# Patient Record
Sex: Male | Born: 1985 | Race: White | Hispanic: No | Marital: Single | Smoking: Current every day smoker
Health system: Southern US, Community
[De-identification: ages and names within clinical notes are randomized; demographics above are authoritative.]

## PROBLEM LIST (undated history)

## (undated) DIAGNOSIS — F419 Anxiety disorder, unspecified: Secondary | ICD-10-CM

## (undated) DIAGNOSIS — J45909 Unspecified asthma, uncomplicated: Secondary | ICD-10-CM

## (undated) DIAGNOSIS — R569 Unspecified convulsions: Secondary | ICD-10-CM

## (undated) HISTORY — PX: EYE SURGERY: SHX253

---
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
## 2015-05-06 ENCOUNTER — Encounter (HOSPITAL_COMMUNITY): Payer: Self-pay | Admitting: *Deleted

## 2015-05-06 ENCOUNTER — Emergency Department (HOSPITAL_COMMUNITY)
Admission: EM | Admit: 2015-05-06 | Discharge: 2015-05-07 | Disposition: A | Discharge status: Home / Self Care | Payer: Self-pay | Attending: Emergency Medicine | Admitting: Emergency Medicine

## 2015-05-06 ENCOUNTER — Emergency Department (HOSPITAL_COMMUNITY)
Admission: EM | Admit: 2015-05-06 | Discharge: 2015-05-06 | Disposition: A | Discharge status: Home / Self Care | Payer: Self-pay | Attending: Emergency Medicine | Admitting: Emergency Medicine

## 2015-05-06 ENCOUNTER — Encounter (HOSPITAL_COMMUNITY): Payer: Self-pay | Admitting: Emergency Medicine

## 2015-05-06 DIAGNOSIS — R42 Dizziness and giddiness: Secondary | ICD-10-CM | POA: Insufficient documentation

## 2015-05-06 DIAGNOSIS — R531 Weakness: Secondary | ICD-10-CM | POA: Insufficient documentation

## 2015-05-06 DIAGNOSIS — J45909 Unspecified asthma, uncomplicated: Secondary | ICD-10-CM | POA: Insufficient documentation

## 2015-05-06 DIAGNOSIS — Z792 Long term (current) use of antibiotics: Secondary | ICD-10-CM | POA: Insufficient documentation

## 2015-05-06 DIAGNOSIS — G40909 Epilepsy, unspecified, not intractable, without status epilepticus: Secondary | ICD-10-CM | POA: Insufficient documentation

## 2015-05-06 DIAGNOSIS — E860 Dehydration: Secondary | ICD-10-CM | POA: Insufficient documentation

## 2015-05-06 DIAGNOSIS — Z720 Tobacco use: Secondary | ICD-10-CM | POA: Insufficient documentation

## 2015-05-06 HISTORY — DX: Anxiety disorder, unspecified: F41.9

## 2015-05-06 HISTORY — DX: Unspecified asthma, uncomplicated: J45.909

## 2015-05-06 HISTORY — DX: Unspecified convulsions: R56.9

## 2015-05-06 LAB — CBC WITH DIFFERENTIAL/PLATELET
BASOS ABS: 0 10*3/uL (ref 0.0–0.1)
BASOS PCT: 0 %
EOS ABS: 0.1 10*3/uL (ref 0.0–0.7)
EOS PCT: 1 %
HCT: 45.5 % (ref 39.0–52.0)
Hemoglobin: 15.6 g/dL (ref 13.0–17.0)
Lymphocytes Relative: 26 %
Lymphs Abs: 2.2 10*3/uL (ref 0.7–4.0)
MCH: 28.9 pg (ref 26.0–34.0)
MCHC: 34.3 g/dL (ref 30.0–36.0)
MCV: 84.3 fL (ref 78.0–100.0)
MONO ABS: 0.7 10*3/uL (ref 0.1–1.0)
MONOS PCT: 9 %
NEUTROS ABS: 5.4 10*3/uL (ref 1.7–7.7)
Neutrophils Relative %: 64 %
PLATELETS: 208 10*3/uL (ref 150–400)
RBC: 5.4 MIL/uL (ref 4.22–5.81)
RDW: 13.1 % (ref 11.5–15.5)
WBC: 8.4 10*3/uL (ref 4.0–10.5)

## 2015-05-06 LAB — I-STAT CHEM 8, ED
BUN: 16 mg/dL (ref 6–20)
CALCIUM ION: 1.17 mmol/L (ref 1.12–1.23)
CREATININE: 0.8 mg/dL (ref 0.61–1.24)
Chloride: 102 mmol/L (ref 101–111)
Glucose, Bld: 116 mg/dL — ABNORMAL HIGH (ref 65–99)
HEMATOCRIT: 52 % (ref 39.0–52.0)
HEMOGLOBIN: 17.7 g/dL — AB (ref 13.0–17.0)
Potassium: 4.4 mmol/L (ref 3.5–5.1)
Sodium: 138 mmol/L (ref 135–145)
TCO2: 26 mmol/L (ref 0–100)

## 2015-05-06 LAB — BASIC METABOLIC PANEL
ANION GAP: 7 (ref 5–15)
BUN: 16 mg/dL (ref 6–20)
CALCIUM: 9 mg/dL (ref 8.9–10.3)
CO2: 25 mmol/L (ref 22–32)
CREATININE: 0.92 mg/dL (ref 0.61–1.24)
Chloride: 103 mmol/L (ref 101–111)
Glucose, Bld: 111 mg/dL — ABNORMAL HIGH (ref 65–99)
Potassium: 4.6 mmol/L (ref 3.5–5.1)
SODIUM: 135 mmol/L (ref 135–145)

## 2015-05-06 MED ORDER — SODIUM CHLORIDE 0.9 % IV BOLUS (SEPSIS)
2000.0000 mL | Freq: Once | INTRAVENOUS | Status: AC
  Administered 2015-05-06: 2000 mL via INTRAVENOUS

## 2015-05-06 NOTE — ED Provider Notes (Signed)
CSN: [id number]     Arrival date & time 05/06/15  2023 History   First MD Initiated Contact with Patient 05/06/15 2306     Chief Complaint  Patient presents with  . Weakness  . Dizziness     (Consider location/radiation/quality/duration/timing/severity/associated sxs/prior Treatment) HPI Patient presents with 3 days of generalized weakness and lightheadedness when standing. Was seen earlier today at [facility]. Had basic blood work performed and was discharged home. States that the generalized weakness and lightheadedness have persisted. States he is taking his Dilantin as prescribed. No known seizure activity. No fever or chills. No chest pain or shortness of breath. No nausea vomiting or diarrhea. Patient states he is eating and drinking normally. Past Medical History  Diagnosis Date  . Seizures (HCC)   . Asthma   . Anxiety    History reviewed. No pertinent past surgical history. No family history on file. Social History  Substance Use Topics  . Smoking status: Current Every Day Smoker -- 0.00 packs/day    Types: Cigarettes  . Smokeless tobacco: None  . Alcohol Use: No    Review of Systems  Constitutional: Negative for fever and chills.  Eyes: Negative for visual disturbance.  Respiratory: Negative for shortness of breath.   Cardiovascular: Negative for chest pain.  Gastrointestinal: Negative for nausea, vomiting, abdominal pain and diarrhea.  Genitourinary: Negative for dysuria, frequency and flank pain.  Musculoskeletal: Negative for back pain and neck pain.  Skin: Negative for rash and wound.  Neurological: Positive for dizziness, weakness (generalized) and light-headedness. Negative for seizures, syncope, numbness and headaches.  All other systems reviewed and are negative.     Allergies  Review of patient's allergies indicates no known allergies.  Home Medications   Prior to Admission medications   Medication Sig Start Date End Date Taking? Authorizing  Provider  amoxicillin (AMOXIL) 500 MG capsule Take 500 mg by mouth 3 (three) times daily.   Yes Historical Provider, MD  hydrOXYzine (VISTARIL) 50 MG capsule Take 50 mg by mouth 2 (two) times daily as needed for anxiety.   Yes Historical Provider, MD  ibuprofen (ADVIL,MOTRIN) 200 MG tablet Take 400 mg by mouth every 6 (six) hours as needed for moderate pain.   Yes Historical Provider, MD  phenytoin (DILANTIN) 100 MG ER capsule Take 200 mg by mouth 2 (two) times daily.   Yes Historical Provider, MD   BP 124/91 mmHg  Pulse 79  Temp(Src) 98 F (36.7 C) (Oral)  Resp 16  Ht 5\' 6"  (1.676 m)  Wt 165 lb (74.844 kg)  BMI 26.64 kg/m2  SpO2 96% Physical Exam  Constitutional: He is oriented to person, place, and time. He appears well-developed and well-nourished. No distress.  HENT:  Head: Normocephalic and atraumatic.  Mouth/Throat: Oropharynx is clear and moist.  Dry lips  Eyes: EOM are normal. Pupils are equal, round, and reactive to light.  Neck: Normal range of motion. Neck supple.  No meningismus  Cardiovascular: Normal rate and regular rhythm.   Pulmonary/Chest: Effort normal and breath sounds normal. No respiratory distress. He has no wheezes. He has no rales.  Abdominal: Soft. Bowel sounds are normal. He exhibits no distension. There is no tenderness. There is no rebound and no guarding.  Musculoskeletal: Normal range of motion. He exhibits no edema or tenderness.  Neurological: He is alert and oriented to person, place, and time.  Patient is alert and oriented x3 with clear, goal oriented speech. Patient has 5/5 motor in all extremities. Sensation is intact  to light touch. Bilateral finger-to-nose is normal with no signs of dysmetria. Patient has a normal gait and walks without assistance.  Skin: Skin is warm and dry. No rash noted. No erythema.  Psychiatric: He has a normal mood and affect. His behavior is normal.  Nursing note and vitals reviewed.   ED Course  Procedures  (including critical care time) Labs Review Labs Reviewed  BASIC METABOLIC PANEL - Abnormal; Notable for the following:    Glucose, Bld 111 (*)    All other components within normal limits  PHENYTOIN LEVEL, TOTAL - Abnormal; Notable for the following:    Phenytoin Lvl <2.5 (*)    All other components within normal limits  CBC WITH DIFFERENTIAL/PLATELET  URINALYSIS, ROUTINE W REFLEX MICROSCOPIC (NOT AT [facility])    Imaging Review No results found. I have personally reviewed and evaluated these images and lab results as part of my medical decision-making.   EKG Interpretation   Date/Time:  Friday May 06 2015 22:40:52 EDT Ventricular Rate:  80 PR Interval:  146 QRS Duration: 94 QT Interval:  369 QTC Calculation: 426 R Axis:   59 Text Interpretation:  Sinus rhythm Confirmed by Ranae Palms  MD, Ranveer  ([id number]) on 05/06/2015 11:40:34 PM      MDM   Final diagnoses:  Dehydration    Patient given several liters of IV fluids in the emergency department. Heart rate has improved. No evidence of orthopnea. Patient also was loaded with Dilantin. He is encouraged to drink plenty of clear fluids. Return precautions given.    Loren Racer, MD 05/07/15 [phone]

## 2015-05-06 NOTE — ED Notes (Signed)
Pt arrives to the ER via EMS; pt states that when he stood up earlier he felt weak and had to sit back down on the bench; pt states that is similar to what happened to him the last time he had a seizure; pt states that he has not had a seizure in over a year; pt states that he has been taking his Dilantin as prescribed; pt denies weakness at present; pt advised EMS that he wanted a place to lay tonight other than a bench outside; pt is homeless

## 2015-05-06 NOTE — ED Notes (Signed)
Pt. reports generalized weakness with lightheaded/dizziness onset this week , denies fever , [facility] this morning for the same complaints discharged home .

## 2015-05-06 NOTE — ED Notes (Signed)
Per EMS, patient c/o feeling weakness & chills. Patient states weakness that started an hour ago. Patient reports 1 year ago that he had the same feeling before having a seizure then. Patient reports hx of seizures (takes Dilantin), anxiety, asthma.

## 2015-05-06 NOTE — Discharge Instructions (Signed)

## 2015-05-06 NOTE — ED Provider Notes (Addendum)
CSN: [id number]     Arrival date & time 05/06/15  0413 History   First MD Initiated Contact with Patient 05/06/15 0700     Chief Complaint  Patient presents with  . Weakness     (Consider location/radiation/quality/duration/timing/severity/associated sxs/prior Treatment) Patient is a 29 y.o. male presenting with weakness. The history is provided by the patient.  Weakness This is a new problem. The current episode started 1 to 2 hours ago. The problem occurs rarely. The problem has been resolved. Pertinent negatives include no chest pain, no abdominal pain and no shortness of breath. Nothing aggravates the symptoms. Nothing relieves the symptoms. He has tried nothing for the symptoms.    Past Medical History  Diagnosis Date  . Seizures (HCC)   . Asthma    History reviewed. No pertinent past surgical history. No family history on file. Social History  Substance Use Topics  . Smoking status: Current Every Day Smoker -- 0.50 packs/day    Types: Cigarettes  . Smokeless tobacco: None  . Alcohol Use: No    Review of Systems  Respiratory: Negative for shortness of breath.   Cardiovascular: Negative for chest pain.  Gastrointestinal: Negative for abdominal pain.  Neurological: Positive for weakness.  All other systems reviewed and are negative.     Allergies  Review of patient's allergies indicates no known allergies.  Home Medications   Prior to Admission medications   Medication Sig Start Date End Date Taking? Authorizing Provider  amoxicillin (AMOXIL) 500 MG capsule Take 500 mg by mouth 3 (three) times daily.   Yes Historical Provider, MD  hydrOXYzine (VISTARIL) 50 MG capsule Take 50 mg by mouth 2 (two) times daily as needed for anxiety.   Yes Historical Provider, MD  ibuprofen (ADVIL,MOTRIN) 200 MG tablet Take 400 mg by mouth every 6 (six) hours as needed for moderate pain.   Yes Historical Provider, MD  phenytoin (DILANTIN) 100 MG ER capsule Take 200 mg by mouth 2 (two)  times daily.   Yes Historical Provider, MD   BP 139/84 mmHg  Pulse 98  Temp(Src) 98.4 F (36.9 C) (Oral)  Resp 20  SpO2 96% Physical Exam  Constitutional: He is oriented to person, place, and time. He appears well-developed and well-nourished. No distress.  HENT:  Head: Normocephalic and atraumatic.  Eyes: Conjunctivae are normal.  Neck: Neck supple. No tracheal deviation present.  Cardiovascular: Normal rate and regular rhythm.   Pulmonary/Chest: Effort normal. No respiratory distress.  Abdominal: Soft. He exhibits no distension. There is no tenderness.  Neurological: He is alert and oriented to person, place, and time. He has normal strength. No cranial nerve deficit. Coordination and gait normal. GCS eye subscore is 4. GCS verbal subscore is 5. GCS motor subscore is 6.  Skin: Skin is warm and dry.  Psychiatric: He has a normal mood and affect.    ED Course  Procedures (including critical care time) Labs Review Labs Reviewed  I-STAT CHEM 8, ED - Abnormal; Notable for the following:    Glucose, Bld 116 (*)    Hemoglobin 17.7 (*)    All other components within normal limits    Imaging Review No results found. I have personally reviewed and evaluated these images and lab results as part of my medical decision-making.   EKG Interpretation None      MDM   Final diagnoses:  Feeling weak    29 year old male presents with mild weakness after waking up on a bench this morning. He has a stated  history of seizures, anxiety and is on Dilantin which she says he is compliant with. He states last time he felt like this he had a seizure immediately following. No seizure activity today on or prior to arrival. Screening chem 8 unremarkable. Otherwise well-appearing. Stable for discharge, needs to establish PCP. Return precautions discussed.    Lyndal Pulley, MD 05/07/15 [phone]

## 2015-05-06 NOTE — ED Notes (Signed)
Bed: [facility]1 Expected date:  Expected time:  Means of arrival:  Comments: EMS 52M "weak"

## 2015-05-07 LAB — URINALYSIS, ROUTINE W REFLEX MICROSCOPIC
Bilirubin Urine: NEGATIVE
Glucose, UA: NEGATIVE mg/dL
Hgb urine dipstick: NEGATIVE
Ketones, ur: NEGATIVE mg/dL
LEUKOCYTES UA: NEGATIVE
Nitrite: NEGATIVE
PROTEIN: NEGATIVE mg/dL
Specific Gravity, Urine: 1.024 (ref 1.005–1.030)
UROBILINOGEN UA: 0.2 mg/dL (ref 0.0–1.0)
pH: 5.5 (ref 5.0–8.0)

## 2015-05-07 LAB — PHENYTOIN LEVEL, TOTAL

## 2015-05-07 MED ORDER — SODIUM CHLORIDE 0.9 % IV SOLN
1000.0000 mg | Freq: Once | INTRAVENOUS | Status: AC
  Administered 2015-05-07: 1000 mg via INTRAVENOUS
  Filled 2015-05-07: qty 20

## 2015-05-07 MED ORDER — SODIUM CHLORIDE 0.9 % IV BOLUS (SEPSIS)
1000.0000 mL | Freq: Once | INTRAVENOUS | Status: AC
  Administered 2015-05-07: 1000 mL via INTRAVENOUS

## 2015-05-07 NOTE — Discharge Instructions (Signed)

## 2015-05-07 NOTE — ED Notes (Signed)
This RN removed the pt's IV - 20g left AC. Area is clean, dry, and intact, and there is no bleeding.

## 2015-06-02 ENCOUNTER — Emergency Department (HOSPITAL_COMMUNITY): Payer: Self-pay

## 2015-06-02 ENCOUNTER — Encounter (HOSPITAL_COMMUNITY): Payer: Self-pay

## 2015-06-02 ENCOUNTER — Emergency Department (HOSPITAL_COMMUNITY)
Admission: EM | Admit: 2015-06-02 | Discharge: 2015-06-02 | Disposition: A | Discharge status: Home / Self Care | Payer: Self-pay | Attending: Emergency Medicine | Admitting: Emergency Medicine

## 2015-06-02 DIAGNOSIS — F419 Anxiety disorder, unspecified: Secondary | ICD-10-CM | POA: Insufficient documentation

## 2015-06-02 DIAGNOSIS — R000 Tachycardia, unspecified: Secondary | ICD-10-CM | POA: Insufficient documentation

## 2015-06-02 DIAGNOSIS — Z792 Long term (current) use of antibiotics: Secondary | ICD-10-CM | POA: Insufficient documentation

## 2015-06-02 DIAGNOSIS — F17210 Nicotine dependence, cigarettes, uncomplicated: Secondary | ICD-10-CM | POA: Insufficient documentation

## 2015-06-02 DIAGNOSIS — J45909 Unspecified asthma, uncomplicated: Secondary | ICD-10-CM | POA: Insufficient documentation

## 2015-06-02 DIAGNOSIS — J069 Acute upper respiratory infection, unspecified: Secondary | ICD-10-CM | POA: Insufficient documentation

## 2015-06-02 DIAGNOSIS — R569 Unspecified convulsions: Secondary | ICD-10-CM | POA: Insufficient documentation

## 2015-06-02 DIAGNOSIS — Z79899 Other long term (current) drug therapy: Secondary | ICD-10-CM | POA: Insufficient documentation

## 2015-06-02 LAB — CBC
HEMATOCRIT: 46.2 % (ref 39.0–52.0)
HEMOGLOBIN: 15.6 g/dL (ref 13.0–17.0)
MCH: 28.6 pg (ref 26.0–34.0)
MCHC: 33.8 g/dL (ref 30.0–36.0)
MCV: 84.6 fL (ref 78.0–100.0)
PLATELETS: 206 10*3/uL (ref 150–400)
RBC: 5.46 MIL/uL (ref 4.22–5.81)
RDW: 12.9 % (ref 11.5–15.5)
WBC: 10.6 10*3/uL — ABNORMAL HIGH (ref 4.0–10.5)

## 2015-06-02 LAB — COMPREHENSIVE METABOLIC PANEL
ALBUMIN: 4.3 g/dL (ref 3.5–5.0)
ALT: 49 U/L (ref 17–63)
ANION GAP: 8 (ref 5–15)
AST: 26 U/L (ref 15–41)
Alkaline Phosphatase: 80 U/L (ref 38–126)
BILIRUBIN TOTAL: 0.6 mg/dL (ref 0.3–1.2)
BUN: 12 mg/dL (ref 6–20)
CHLORIDE: 103 mmol/L (ref 101–111)
CO2: 26 mmol/L (ref 22–32)
Calcium: 9.3 mg/dL (ref 8.9–10.3)
Creatinine, Ser: 0.98 mg/dL (ref 0.61–1.24)
GFR calc Af Amer: >60 mL/min (ref >60)
GFR calc non Af Amer: >60 mL/min (ref >60)
GLUCOSE: 99 mg/dL (ref 65–99)
POTASSIUM: 4.7 mmol/L (ref 3.5–5.1)
SODIUM: 137 mmol/L (ref 135–145)
TOTAL PROTEIN: 7 g/dL (ref 6.5–8.1)

## 2015-06-02 LAB — URINALYSIS, ROUTINE W REFLEX MICROSCOPIC
BILIRUBIN URINE: NEGATIVE
Glucose, UA: NEGATIVE mg/dL
Hgb urine dipstick: NEGATIVE
KETONES UR: NEGATIVE mg/dL
Leukocytes, UA: NEGATIVE
NITRITE: NEGATIVE
PH: 5 (ref 5.0–8.0)
PROTEIN: NEGATIVE mg/dL
Specific Gravity, Urine: 1.026 (ref 1.005–1.030)

## 2015-06-02 LAB — LIPASE, BLOOD: LIPASE: 30 U/L (ref 11–51)

## 2015-06-02 MED ORDER — ONDANSETRON 4 MG PO TBDP: 4.0000 mg | ORAL_TABLET | Freq: Three times a day (TID) | ORAL | Status: DC | PRN

## 2015-06-02 MED ORDER — BENZONATATE 100 MG PO CAPS: 100.0000 mg | ORAL_CAPSULE | Freq: Three times a day (TID) | ORAL | Status: DC

## 2015-06-02 MED ORDER — GUAIFENESIN ER 600 MG PO TB12: 600.0000 mg | ORAL_TABLET | Freq: Two times a day (BID) | ORAL | Status: DC

## 2015-06-02 MED ORDER — OSELTAMIVIR PHOSPHATE 75 MG PO CAPS: 75.0000 mg | ORAL_CAPSULE | Freq: Two times a day (BID) | ORAL | Status: DC

## 2015-06-02 MED ORDER — SODIUM CHLORIDE 0.9 % IV BOLUS (SEPSIS)
1000.0000 mL | Freq: Once | INTRAVENOUS | Status: AC
  Administered 2015-06-02: 1000 mL via INTRAVENOUS

## 2015-06-02 MED ORDER — ONDANSETRON 4 MG PO TBDP
4.0000 mg | ORAL_TABLET | Freq: Once | ORAL | Status: AC
  Administered 2015-06-02: 4 mg via ORAL
  Filled 2015-06-02: qty 1

## 2015-06-02 NOTE — ED Provider Notes (Signed)
CSN: [id number]     Arrival date & time 06/02/15  1459 History   First MD Initiated Contact with Patient 06/02/15 1728     Chief Complaint  Patient presents with  . Cough  . Emesis    HPI   Miguel Gallegos is an 29 y.o. male with h/o seizures and asthma who presents to the ED for evaluation of cough, n/v, congestion. He states he was in his usual state of health until yesterday when he started having cold-like symptoms. States he started having a cough that is productive of thin yellow sputum. He states that his chest feels tight but denies pain or SOB. He has not required his rescue inhaler at home. Denies increased WOB or wheezing. He states that he also has been unable to tolerate PO. He states that he had "many" episodes of NBNB emesis yesterday and today, last episode just PTA. He states that he is not currently nauseated but every time he eats he will start feeling sick and throw up. He also endorses watery diarrhea. Denies abdominal pain or blood in his stools. Denies recent seizure, feeling faint or lightheaded, myalgias. Denies fever, chills, urinary problems. He admits to smoking 0.5 PPD. Denies EtOH, MJ, cocaine, or other drug use. Denies sick contacts. He states he did get his flu shot this year.  Past Medical History  Diagnosis Date  . Seizures (HCC)   . Asthma   . Anxiety    History reviewed. No pertinent past surgical history. No family history on file. Social History  Substance Use Topics  . Smoking status: Current Every Day Smoker -- 0.00 packs/day    Types: Cigarettes  . Smokeless tobacco: None  . Alcohol Use: No    Review of Systems  All other systems reviewed and are negative.     Allergies  Review of patient's allergies indicates no known allergies.  Home Medications   Prior to Admission medications   Medication Sig Start Date End Date Taking? Authorizing Provider  amoxicillin (AMOXIL) 500 MG capsule Take 500 mg by mouth 3 (three) times daily.    Historical  Provider, MD  hydrOXYzine (VISTARIL) 50 MG capsule Take 50 mg by mouth 2 (two) times daily as needed for anxiety.    Historical Provider, MD  ibuprofen (ADVIL,MOTRIN) 200 MG tablet Take 400 mg by mouth every 6 (six) hours as needed for moderate pain.    Historical Provider, MD  phenytoin (DILANTIN) 100 MG ER capsule Take 200 mg by mouth 2 (two) times daily.    Historical Provider, MD   BP 139/93 mmHg  Pulse 102  Temp(Src) 98.7 F (37.1 C) (Oral)  Resp 14  SpO2 97% Physical Exam  Constitutional: He is oriented to person, place, and time. No distress.  Appears uncomfortable, ill but NAD  HENT:  Head: Atraumatic.  Right Ear: External ear normal.  Left Ear: External ear normal.  Nose: Mucosal edema present.  Mouth/Throat: Posterior oropharyngeal erythema present. No oropharyngeal exudate.  Eyes: Conjunctivae and EOM are normal. Pupils are equal, round, and reactive to light.  Neck: Normal range of motion. Neck supple.  Cardiovascular: Regular rhythm, normal heart sounds and intact distal pulses.  Tachycardia present.   No murmur heard. Pulmonary/Chest: Effort normal and breath sounds normal. No accessory muscle usage. No respiratory distress. He has no wheezes. He has no rales.  Abdominal: Soft. Bowel sounds are normal. He exhibits no distension. There is no tenderness. There is no rebound.  Musculoskeletal: Normal range of motion. He exhibits  no edema or tenderness.  Lymphadenopathy:    He has no cervical adenopathy.  Neurological: He is alert and oriented to person, place, and time. No cranial nerve deficit.  Skin: Skin is warm and dry. No rash noted. He is not diaphoretic. No erythema. No pallor.  Nursing note and vitals reviewed.   ED Course  Procedures (including critical care time) Labs Review Labs Reviewed  CBC - Abnormal; Notable for the following:    WBC 10.6 (*)    All other components within normal limits  LIPASE, BLOOD  COMPREHENSIVE METABOLIC PANEL  URINALYSIS,  ROUTINE W REFLEX MICROSCOPIC (NOT AT [facility])    Imaging Review Dg Chest 2 View  06/02/2015  CLINICAL DATA:  Cough and congestion.  Chest pain. EXAM: CHEST - 2 VIEW COMPARISON:  None. FINDINGS: The heart size and mediastinal contours are within normal limits. Both lungs are clear. The visualized skeletal structures are unremarkable. IMPRESSION: No active disease. Electronically Signed   By: Marin Roberts M.D.   On: 06/02/2015 18:43   I have personally reviewed and evaluated these images and lab results as part of my medical decision-making.   EKG Interpretation None      MDM   Final diagnoses:  Upper respiratory infection    Could be flu/viral vs pneumonia. Given pt's tachycardia, slightly elevated white count, and clinical appearance, will get CXR to r/o pneumonia. Although his lungs sound clear other than upper airway congestion and his exam is otherwise nonfocal, he does not look well. Will give fluids and zofran as well.   CXR negative. Likely viral. Discussed findings with pt. He would like rx for tamiflu even though he had flu vaccine. He is within 48h of symptom onset so will give rx. Will also give rx for zofran, mucinex, and tessalon. Pt has albuterol at home. Return precautions given.   Carlene Coria, PA-C 06/02/15 1903  Lorre Nick, MD 06/03/15 [phone]

## 2015-06-02 NOTE — ED Notes (Signed)
Pt stable, ambulatory, states understanding of discharge instructions 

## 2015-06-02 NOTE — Discharge Instructions (Signed)
Your chest x-ray and labs were normal. You likely have a virus. Since you got the flu vaccine it is unlikely that this is the flu but as we discussed I can give you a prescription for Tamiflu given how bad you are feeling and we are still within an appropriate time window. Please follow-up with your primary care provider within one week. Return to the ER for new or worsening symptoms.   Upper Respiratory Infection, Adult Most upper respiratory infections (URIs) are a viral infection of the air passages leading to the lungs. A URI affects the nose, throat, and upper air passages. The most common type of URI is nasopharyngitis and is typically referred to as "the common cold." URIs run their course and usually go away on their own. Most of the time, a URI does not require medical attention, but sometimes a bacterial infection in the upper airways can follow a viral infection. This is called a secondary infection. Sinus and middle ear infections are common types of secondary upper respiratory infections. Bacterial pneumonia can also complicate a URI. A URI can worsen asthma and chronic obstructive pulmonary disease (COPD). Sometimes, these complications can require emergency medical care and may be life threatening.  CAUSES Almost all URIs are caused by viruses. A virus is a type of germ and can spread from one person to another.  RISKS FACTORS You may be at risk for a URI if:   You smoke.   You have chronic heart or lung disease.  You have a weakened defense (immune) system.   You are very young or very old.   You have nasal allergies or asthma.  You work in crowded or poorly ventilated areas.  You work in health care facilities or schools. SIGNS AND SYMPTOMS  Symptoms typically develop 2-3 days after you come in contact with a cold virus. Most viral URIs last 7-10 days. However, viral URIs from the influenza virus (flu virus) can last 14-18 days and are typically more severe. Symptoms may  include:   Runny or stuffy (congested) nose.   Sneezing.   Cough.   Sore throat.   Headache.   Fatigue.   Fever.   Loss of appetite.   Pain in your forehead, behind your eyes, and over your cheekbones (sinus pain).  Muscle aches.  DIAGNOSIS  Your health care provider may diagnose a URI by:  Physical exam.  Tests to check that your symptoms are not due to another condition such as:  Strep throat.  Sinusitis.  Pneumonia.  Asthma. TREATMENT  A URI goes away on its own with time. It cannot be cured with medicines, but medicines may be prescribed or recommended to relieve symptoms. Medicines may help:  Reduce your fever.  Reduce your cough.  Relieve nasal congestion. HOME CARE INSTRUCTIONS   Take medicines only as directed by your health care provider.   Gargle warm saltwater or take cough drops to comfort your throat as directed by your health care provider.  Use a warm mist humidifier or inhale steam from a shower to increase air moisture. This may make it easier to breathe.  Drink enough fluid to keep your urine clear or pale yellow.   Eat soups and other clear broths and maintain good nutrition.   Rest as needed.   Return to work when your temperature has returned to normal or as your health care provider advises. You may need to stay home longer to avoid infecting others. You can also use a face mask  and careful hand washing to prevent spread of the virus.  Increase the usage of your inhaler if you have asthma.   Do not use any tobacco products, including cigarettes, chewing tobacco, or electronic cigarettes. If you need help quitting, ask your health care provider. PREVENTION  The best way to protect yourself from getting a cold is to practice good hygiene.   Avoid oral or hand contact with people with cold symptoms.   Wash your hands often if contact occurs.  There is no clear evidence that vitamin C, vitamin E, echinacea, or  exercise reduces the chance of developing a cold. However, it is always recommended to get plenty of rest, exercise, and practice good nutrition.  SEEK MEDICAL CARE IF:   You are getting worse rather than better.   Your symptoms are not controlled by medicine.   You have chills.  You have worsening shortness of breath.  You have brown or red mucus.  You have yellow or brown nasal discharge.  You have pain in your face, especially when you bend forward.  You have a fever.  You have swollen neck glands.  You have pain while swallowing.  You have white areas in the back of your throat. SEEK IMMEDIATE MEDICAL CARE IF:   You have severe or persistent:  Headache.  Ear pain.  Sinus pain.  Chest pain.  You have chronic lung disease and any of the following:  Wheezing.  Prolonged cough.  Coughing up blood.  A change in your usual mucus.  You have a stiff neck.  You have changes in your:  Vision.  Hearing.  Thinking.  Mood. MAKE SURE YOU:   Understand these instructions.  Will watch your condition.  Will get help right away if you are not doing well or get worse.   This information is not intended to replace advice given to you by your health care provider. Make sure you discuss any questions you have with your health care provider.   Document Released: 12/26/2000 Document Revised: 11/16/2014 Document Reviewed: 10/07/2013 Elsevier Interactive Patient Education [employer]2016 [employer]  Please obtain all of your results from medical records or have your doctors office obtain the results - share them with your doctor - you should be seen at your doctors office in the next 2 days. Call today to arrange your follow up. Take the medications as prescribed. Please review all of the medicines and only take them if you do not have an allergy to them. Please be aware that if you are taking birth control pills, taking other prescriptions, ESPECIALLY ANTIBIOTICS may make  the birth control ineffective - if this is the case, either do not engage in sexual activity or use alternative methods of birth control such as condoms until you have finished the medicine and your family doctor says it is OK to restart them. If you are on a blood thinner such as COUMADIN, be aware that any other medicine that you take may cause the coumadin to either work too much, or not enough - you should have your coumadin level rechecked in next 7 days if this is the case.  ?  It is also a possibility that you have an allergic reaction to any of the medicines that you have been prescribed - Everybody reacts differently to medications and while MOST people have no trouble with most medicines, you may have a reaction such as nausea, vomiting, rash, swelling, shortness of breath. If this is the case, please stop taking  the medicine immediately and contact your physician.  ?  You should return to the ER if you develop severe or worsening symptoms.

## 2015-06-02 NOTE — ED Notes (Addendum)
Pt here with c/o of congestion, runny nose, productive cough of yellow mucous associated with emesis x 3 today and constant diarrhea since yesterday. All symptoms started yesterday.

## 2016-03-13 IMAGING — CR DG CHEST 2V
2 series · 2 of 2 positions shown · non-contrast
Comparison: None.

CLINICAL DATA: Cough and congestion.  Chest pain.

EXAM:
CHEST - 2 VIEW

[chest pa]
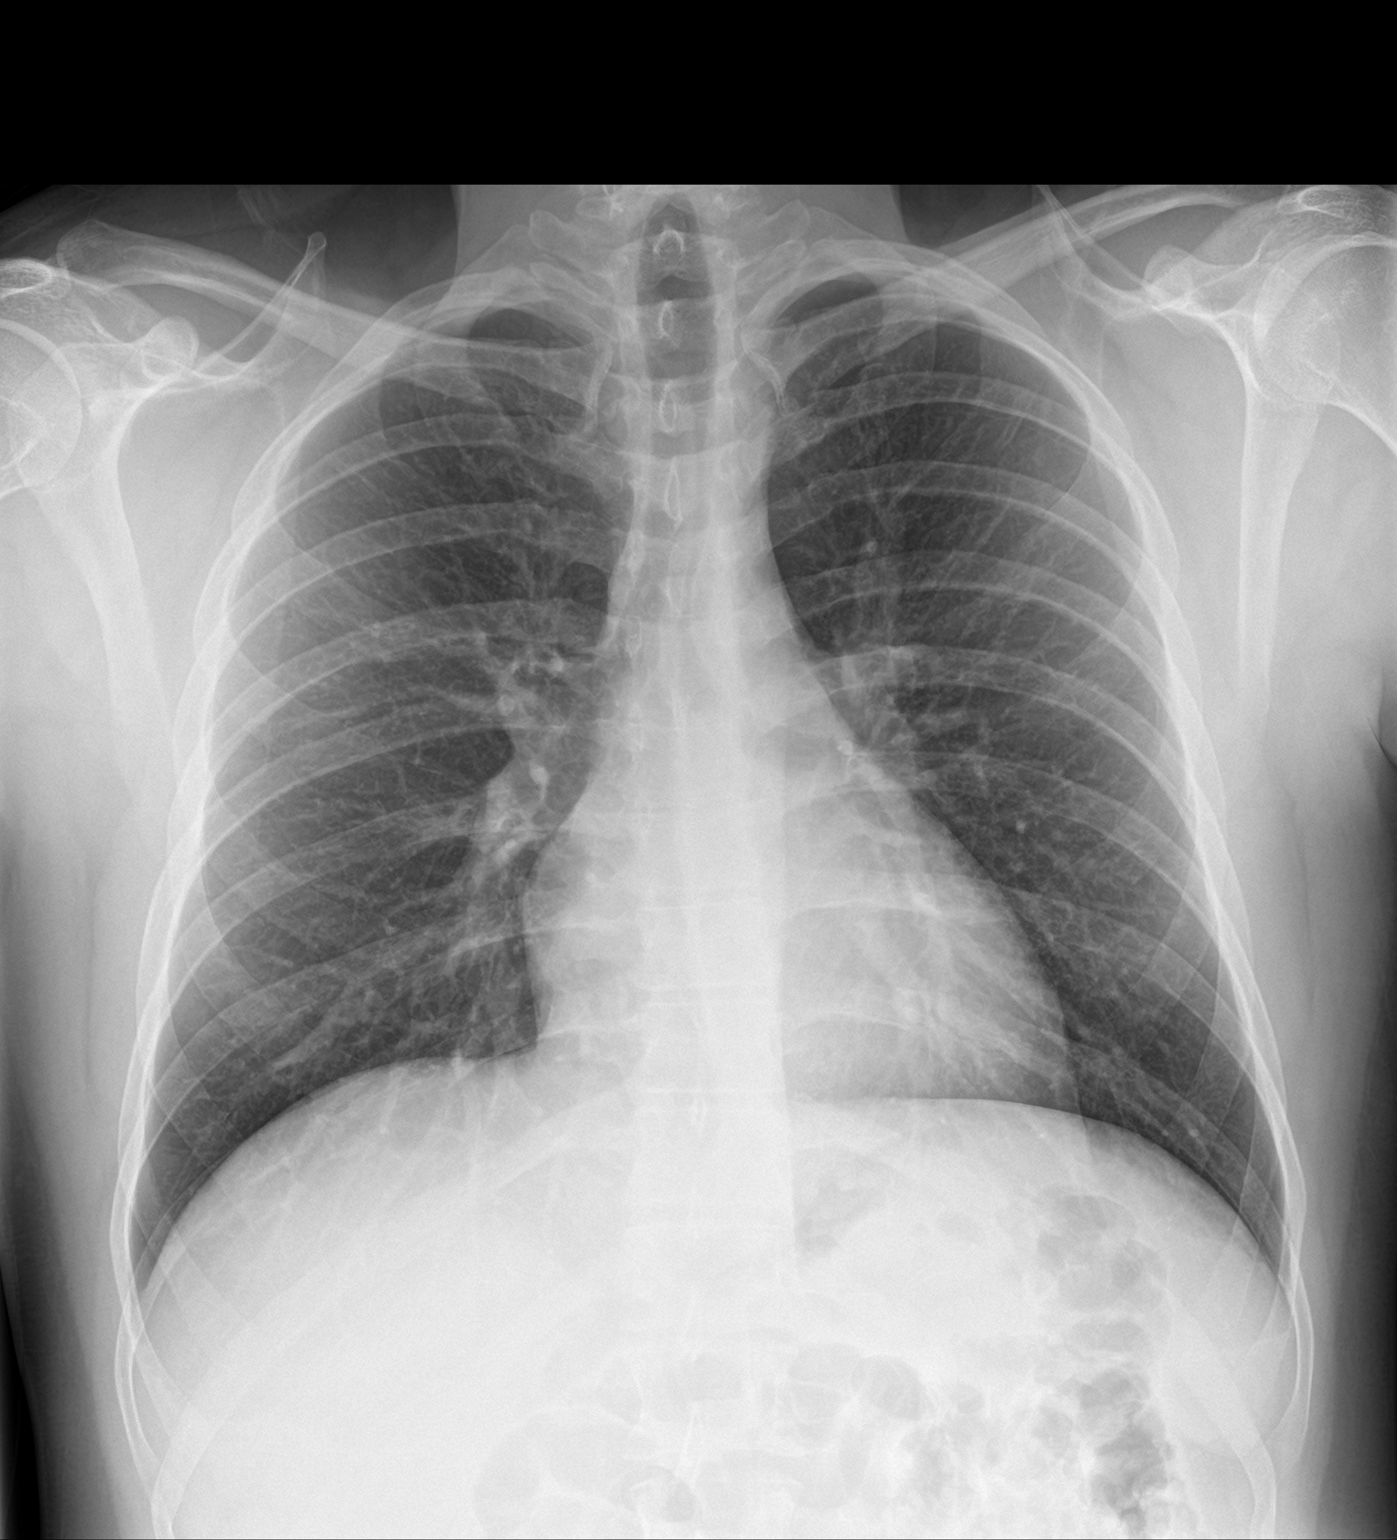

[chest lat]
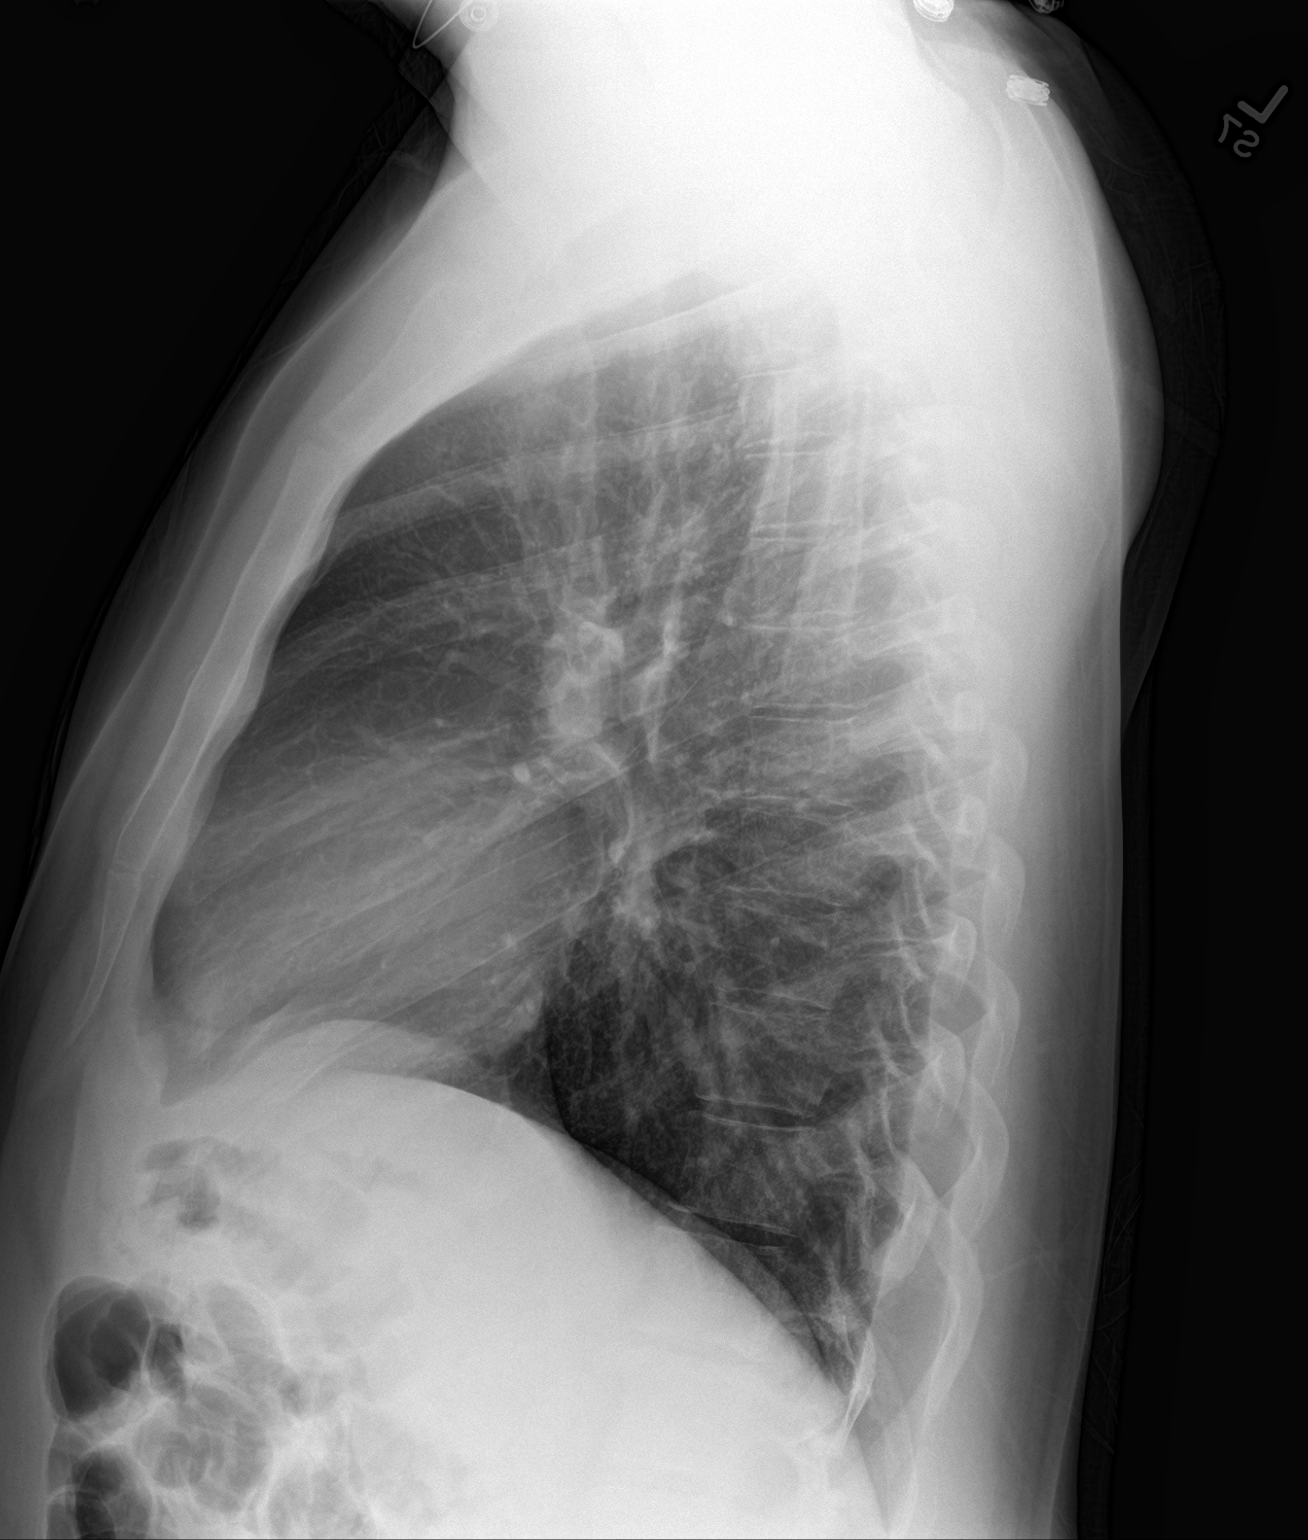

[2 of 2 positions shown; findings below may reference images not displayed]

FINDINGS: The heart size and mediastinal contours are within normal limits.
Both lungs are clear. The visualized skeletal structures are
unremarkable.
IMPRESSION: No active disease.

## 2016-03-24 ENCOUNTER — Emergency Department (HOSPITAL_COMMUNITY)
Admission: EM | Admit: 2016-03-24 | Discharge: 2016-03-24 | Disposition: A | Discharge status: Home / Self Care | Payer: Self-pay | Attending: Emergency Medicine | Admitting: Emergency Medicine

## 2016-03-24 ENCOUNTER — Encounter (HOSPITAL_COMMUNITY): Payer: Self-pay | Admitting: Oncology

## 2016-03-24 ENCOUNTER — Emergency Department (HOSPITAL_COMMUNITY): Payer: Self-pay

## 2016-03-24 DIAGNOSIS — F17210 Nicotine dependence, cigarettes, uncomplicated: Secondary | ICD-10-CM | POA: Insufficient documentation

## 2016-03-24 DIAGNOSIS — J45909 Unspecified asthma, uncomplicated: Secondary | ICD-10-CM | POA: Insufficient documentation

## 2016-03-24 DIAGNOSIS — Z79899 Other long term (current) drug therapy: Secondary | ICD-10-CM | POA: Insufficient documentation

## 2016-03-24 DIAGNOSIS — R202 Paresthesia of skin: Secondary | ICD-10-CM | POA: Insufficient documentation

## 2016-03-24 DIAGNOSIS — M79641 Pain in right hand: Secondary | ICD-10-CM

## 2016-03-24 MED ORDER — NAPROXEN 500 MG PO TABS: 500.0000 mg | ORAL_TABLET | Freq: Two times a day (BID) | ORAL | 0 refills | Status: DC

## 2016-03-24 NOTE — ED Triage Notes (Signed)
Pt c/o throbbing to left hand.  Denies any injury to hand.  States pain has been present x 1 week however has intensified in the last 2 days.

## 2016-03-24 NOTE — ED Provider Notes (Signed)
[facility]-EMERGENCY DEPT Provider Note   CSN: [id number] Arrival date & time: 03/24/16  2128     History   Chief Complaint Chief Complaint  Patient presents with  . Hand Pain    HPI Miguel Gallegos is a 30 y.o. male.  Patient presents with complaint of right hand pain which was initially intermittent approximately one week ago but is gradually worsening. Patient does Office managerlandscaping and janitorial work. He also does a lot of lifting. Pain is worse with movement. Pain in mainly in the ulnar aspect of his wrist which shoots into his hand. He describes tingling in all fingers. No redness or swelling. No elbow or shoulder pain. He has been taking over-the-counter medications without relief. No acute injuries.       Past Medical History:  Diagnosis Date  . Anxiety   . Asthma   . Seizures (HCC)     There are no active problems to display for this patient.   History reviewed. No pertinent surgical history.     Home Medications    Prior to Admission medications   Medication Sig Start Date End Date Taking? Authorizing Provider  benzonatate (TESSALON) 100 MG capsule Take 1 capsule (100 mg total) by mouth every 8 (eight) hours. 06/02/15   Ace GinsSerena Y Sam, PA-C  guaiFENesin (MUCINEX) 600 MG 12 hr tablet Take 1 tablet (600 mg total) by mouth 2 (two) times daily. 06/02/15   Ace GinsSerena Y Sam, PA-C  hydrOXYzine (VISTARIL) 50 MG capsule Take 50 mg by mouth 2 (two) times daily as needed for anxiety.    Historical Provider, MD  ondansetron (ZOFRAN ODT) 4 MG disintegrating tablet Take 1 tablet (4 mg total) by mouth every 8 (eight) hours as needed for nausea or vomiting. 06/02/15   Carlene CoriaSerena Y Sam, PA-C  oseltamivir (TAMIFLU) 75 MG capsule Take 1 capsule (75 mg total) by mouth every 12 (twelve) hours. 06/02/15   Carlene CoriaSerena Y Sam, PA-C    Family History No family history on file.  Social History Social History  Substance Use Topics  . Smoking status: Current Every Day Smoker    Packs/day: 0.00    Types:  Cigarettes  . Smokeless tobacco: Never Used  . Alcohol use No     Allergies   Review of patient's allergies indicates no known allergies.   Review of Systems Review of Systems  Constitutional: Negative for activity change.  Musculoskeletal: Positive for arthralgias and myalgias. Negative for back pain, gait problem, joint swelling and neck pain.  Skin: Negative for wound.  Neurological: Negative for weakness and numbness.     Physical Exam Updated Vital Signs BP 135/76 (BP Location: Left Arm)   Pulse 90   Temp 98.1 F (36.7 C) (Oral)   Resp 15   Ht 5\' 7"  (1.702 m)   Wt 72.6 kg   SpO2 96%   BMI 25.06 kg/m   Physical Exam  Constitutional: He appears well-developed and well-nourished.  HENT:  Head: Normocephalic and atraumatic.  Eyes: Conjunctivae are normal.  Neck: Normal range of motion. Neck supple.  Cardiovascular: Normal pulses.  Exam reveals no decreased pulses.   Musculoskeletal: He exhibits tenderness. He exhibits no edema.       Right shoulder: Normal.       Right elbow: Normal.      Right wrist: He exhibits tenderness (ulnarly). He exhibits normal range of motion and no bony tenderness.       Right forearm: Normal.       Right hand: He exhibits tenderness.  He exhibits normal range of motion and normal capillary refill. Normal sensation noted. Normal strength noted.       Hands: Neurological: He is alert. No sensory deficit.  Motor, sensation, and vascular distal to the injury is fully intact.   Skin: Skin is warm and dry.  Psychiatric: He has a normal mood and affect.  Nursing note and vitals reviewed.    ED Treatments / Results   Radiology Dg Hand Complete Right  Result Date: 03/24/2016 CLINICAL DATA:  Pain for a week without trauma. EXAM: RIGHT HAND - COMPLETE 3+ VIEW COMPARISON:  None. FINDINGS: There is no evidence of fracture or dislocation. There is no evidence of arthropathy or other focal bone abnormality. Soft tissues are unremarkable.  IMPRESSION: Negative. Electronically Signed   By: Gerome Sam III M.D   On: 03/24/2016 23:01    Procedures Procedures (including critical care time)   Initial Impression / Assessment and Plan / ED Course  I have reviewed the triage vital signs and the nursing notes.  Pertinent labs & imaging results that were available during my care of the patient were reviewed by me and considered in my medical decision making (see chart for details).  Clinical Course   Patient seen and examined.   Vital signs reviewed and are as follows: BP 135/76 (BP Location: Left Arm)   Pulse 90   Temp 98.1 F (36.7 C) (Oral)   Resp 15   Ht 5\' 7"  (1.702 m)   Wt 72.6 kg   SpO2 96%   BMI 25.06 kg/m   11:22 PM x-rays negative for fracture. Patient given wrist splint, NSAIDs, and informed of results. Orthopedic hand referral given if not improved in one week. We discussed rice protocol.   Final Clinical Impressions(s) / ED Diagnoses   Final diagnoses:  Pain of right hand  Right hand paresthesia   Patient with right wrist and hand pain, likely overuse injury. Do not suspect carpal tunnel syndrome given distribution of symptoms. Hand is otherwise neurovascularly intact with normal capillary refill. Patient does have some paresthesias of all fingers in no particular distribution. Treatment as above.  New Prescriptions Discharge Medication List as of 03/24/2016 11:17 PM       Renne Crigler, PA-C 03/24/16 2323    Lavera Guise, MD 03/24/16 [phone]

## 2016-03-24 NOTE — Discharge Instructions (Signed)
Please read and follow all provided instructions.  Your diagnoses today include:  1. Pain of right hand   2. Right hand paresthesia     Tests performed today include:  An x-ray of the affected area - does NOT show any broken bones  Vital signs. See below for your results today.   Medications prescribed:   Naproxen - anti-inflammatory pain medication  Do not exceed 500mg  naproxen every 12 hours, take with food  You have been prescribed an anti-inflammatory medication or NSAID. Take with food. Take smallest effective dose for the shortest duration needed for your pain. Stop taking if you experience stomach pain or vomiting.   Take any prescribed medications only as directed.  Home care instructions:   Follow any educational materials contained in this packet  Follow R.I.C.E. Protocol:  R - rest your injury   I  - use ice on injury without applying directly to skin  C - compress injury with bandage or splint  E - elevate the injury as much as possible  Follow-up instructions: Please follow-up with your primary care provider or the provided orthopedic physician (bone specialist) if you continue to have significant pain in 1 week. In this case you may have a more severe injury that requires further care.   Return instructions:   Please return if your fingers are numb or tingling, appear gray or blue, or you have severe pain (also elevate the arm and loosen splint or wrap if you were given one)  Please return to the Emergency Department if you experience worsening symptoms.   Please return if you have any other emergent concerns.  Additional Information:  Your vital signs today were: BP 135/76 (BP Location: Left Arm)    Pulse 90    Temp 98.1 F (36.7 C) (Oral)    Resp 15    Ht 5\' 7"  (1.702 m)    Wt 72.6 kg    SpO2 96%    BMI 25.06 kg/m  If your blood pressure (BP) was elevated above 135/85 this visit, please have this repeated by your doctor within one  month. --------------

## 2016-09-12 ENCOUNTER — Encounter (HOSPITAL_COMMUNITY): Payer: Self-pay | Admitting: Emergency Medicine

## 2016-09-12 ENCOUNTER — Emergency Department (HOSPITAL_COMMUNITY)
Admission: EM | Admit: 2016-09-12 | Discharge: 2016-09-12 | Disposition: A | Discharge status: Home / Self Care | Payer: Self-pay | Attending: Emergency Medicine | Admitting: Emergency Medicine

## 2016-09-12 DIAGNOSIS — J111 Influenza due to unidentified influenza virus with other respiratory manifestations: Secondary | ICD-10-CM | POA: Insufficient documentation

## 2016-09-12 DIAGNOSIS — F17210 Nicotine dependence, cigarettes, uncomplicated: Secondary | ICD-10-CM | POA: Insufficient documentation

## 2016-09-12 DIAGNOSIS — J45909 Unspecified asthma, uncomplicated: Secondary | ICD-10-CM | POA: Insufficient documentation

## 2016-09-12 MED ORDER — OSELTAMIVIR PHOSPHATE 75 MG PO CAPS
75.0000 mg | ORAL_CAPSULE | Freq: Once | ORAL | Status: AC
  Administered 2016-09-12: 75 mg via ORAL
  Filled 2016-09-12: qty 1

## 2016-09-12 MED ORDER — OSELTAMIVIR PHOSPHATE 75 MG PO CAPS: 75.0000 mg | ORAL_CAPSULE | Freq: Two times a day (BID) | ORAL | 0 refills | Status: DC

## 2016-09-12 NOTE — ED Triage Notes (Signed)
Pt sts congestion and cough with vomiting x 2 last night

## 2016-09-12 NOTE — ED Provider Notes (Signed)
[facility]-EMERGENCY DEPT Provider Note   CSN: [id number] Arrival date & time: 09/12/16  1449   By signing my name below, I, Clovis Pu, attest that this documentation has been prepared under the direction and in the presence of Gerhard Munch, MD  Electronically Signed: Clovis Pu, ED Scribe. 09/12/16. 6:00 PM.   History   Chief Complaint Chief Complaint  Patient presents with  . Nasal Congestion  . Cough   The history is provided by the patient. No language interpreter was used.   HPI Comments:  Miguel Gallegos is a 31 y.o. male, with a hx of seizures and asthma, who presents to the Emergency Department complaining of gradual onset, moderate sore throat onset in the AM yesterday. Pt also reports persistent cough, nasal congestion, rhinorrhea, 2 episodes of vomiting and states he has been feeling warm. No alleviating factors noted. Pt denies any other associated symptoms. Pt notes he did have his flu shot this year. Pt is a smoker.   Smoking cessation provided, particularly in light of this patient's evaluation in the ED.   Past Medical History:  Diagnosis Date  . Anxiety   . Asthma   . Seizures (HCC)     There are no active problems to display for this patient.   History reviewed. No pertinent surgical history.  Home Medications    Prior to Admission medications   Medication Sig Start Date End Date Taking? Authorizing Provider  benzonatate (TESSALON) 100 MG capsule Take 1 capsule (100 mg total) by mouth every 8 (eight) hours. 06/02/15   Ace Gins Sam, PA-C  guaiFENesin (MUCINEX) 600 MG 12 hr tablet Take 1 tablet (600 mg total) by mouth 2 (two) times daily. 06/02/15   Ace Gins Sam, PA-C  hydrOXYzine (VISTARIL) 50 MG capsule Take 50 mg by mouth 2 (two) times daily as needed for anxiety.    Historical Provider, MD  naproxen (NAPROSYN) 500 MG tablet Take 1 tablet (500 mg total) by mouth 2 (two) times daily. 03/24/16   Renne Crigler, PA-C  ondansetron (ZOFRAN ODT) 4 MG  disintegrating tablet Take 1 tablet (4 mg total) by mouth every 8 (eight) hours as needed for nausea or vomiting. 06/02/15   Carlene Coria, PA-C  oseltamivir (TAMIFLU) 75 MG capsule Take 1 capsule (75 mg total) by mouth every 12 (twelve) hours. 06/02/15   Carlene Coria, PA-C    Family History History reviewed. No pertinent family history.  Social History Social History  Substance Use Topics  . Smoking status: Current Every Day Smoker    Packs/day: 0.00    Types: Cigarettes  . Smokeless tobacco: Never Used  . Alcohol use No     Allergies   Patient has no known allergies.   Review of Systems Review of Systems  Constitutional: Negative for fever.  HENT: Positive for congestion and sore throat.   Respiratory: Positive for cough. Negative for shortness of breath.   Cardiovascular: Negative for chest pain.  Musculoskeletal:       Negative aside from HPI  Skin:       Negative aside from HPI  Allergic/Immunologic: Negative for immunocompromised state.  Neurological: Negative for weakness.  All other systems reviewed and are negative.  Physical Exam Updated Vital Signs BP 138/92 (BP Location: Right Arm)   Pulse 97   Temp 98.2 F (36.8 C) (Oral)   Resp 17   Ht 5\' 7"  (1.702 m)   Wt 175 lb (79.4 kg)   SpO2 97%   BMI 27.41 kg/m  Physical Exam  Constitutional: He is oriented to person, place, and time. He appears well-developed. No distress.  HENT:  Head: Normocephalic and atraumatic.  Mouth/Throat: Uvula is midline. No uvula swelling. No oropharyngeal exudate.  Eyes: Conjunctivae and EOM are normal.  Cardiovascular: Normal rate, regular rhythm and normal heart sounds.   Pulmonary/Chest: Effort normal and breath sounds normal. No stridor. No respiratory distress.  Abdominal: He exhibits no distension.  Musculoskeletal: He exhibits no edema.  Lymphadenopathy:    He has no cervical adenopathy.  Neurological: He is alert and oriented to person, place, and time.  Skin: Skin  is warm and dry.  Psychiatric: He has a normal mood and affect.  Nursing note and vitals reviewed.  ED Treatments / Results  DIAGNOSTIC STUDIES:  Oxygen Saturation is 97% on RA, normal by my interpretation.    COORDINATION OF CARE:  6:00 PM Discussed treatment plan with pt at bedside and pt agreed to plan.   Procedures Procedures (including critical care time)  Medications Ordered in ED Medications  oseltamivir (TAMIFLU) capsule 75 mg (not administered)     Initial Impression / Assessment and Plan / ED Course  I have reviewed the triage vital signs and the nursing notes.  Pertinent labs & imaging results that were available during my care of the patient were reviewed by me and considered in my medical decision making (see chart for details).  Previously well young male presents with almost 2 days of flulike illness. Here no clinical evidence for sepsis, bacteremia, pneumonia. Patient has influenza-like illness, was started on Tamiflu, empirically. Patient will follow-up with primary care.  Final Clinical Impressions(s) / ED Diagnoses   Final diagnoses:  Influenza    I personally performed the services described in this documentation, which was scribed in my presence. The recorded information has been reviewed and is accurate.      Gerhard Munchobert Amandajo Gonder, MD 09/12/16 [phone]

## 2016-09-12 NOTE — Discharge Instructions (Signed)
As discussed, your evaluation today has been largely reassuring.  But, it is important that you monitor your condition carefully, and do not hesitate to return to the ED if you develop new, or concerning changes in your condition. ? ?Otherwise, please follow-up with your physician for appropriate ongoing care. ? ?

## 2016-12-19 ENCOUNTER — Encounter (HOSPITAL_BASED_OUTPATIENT_CLINIC_OR_DEPARTMENT_OTHER): Payer: Self-pay

## 2016-12-19 ENCOUNTER — Emergency Department (HOSPITAL_BASED_OUTPATIENT_CLINIC_OR_DEPARTMENT_OTHER): Payer: Self-pay

## 2016-12-19 ENCOUNTER — Emergency Department (HOSPITAL_BASED_OUTPATIENT_CLINIC_OR_DEPARTMENT_OTHER)
Admission: EM | Admit: 2016-12-19 | Discharge: 2016-12-19 | Disposition: A | Discharge status: Home / Self Care | Payer: Self-pay | Attending: Emergency Medicine | Admitting: Emergency Medicine

## 2016-12-19 DIAGNOSIS — J4520 Mild intermittent asthma, uncomplicated: Secondary | ICD-10-CM

## 2016-12-19 DIAGNOSIS — R0602 Shortness of breath: Secondary | ICD-10-CM

## 2016-12-19 DIAGNOSIS — F17210 Nicotine dependence, cigarettes, uncomplicated: Secondary | ICD-10-CM | POA: Insufficient documentation

## 2016-12-19 MED ORDER — ALBUTEROL SULFATE HFA 108 (90 BASE) MCG/ACT IN AERS: 2.0000 | INHALATION_SPRAY | RESPIRATORY_TRACT | 0 refills | Status: DC | PRN

## 2016-12-19 MED ORDER — ALBUTEROL SULFATE HFA 108 (90 BASE) MCG/ACT IN AERS
2.0000 | INHALATION_SPRAY | Freq: Once | RESPIRATORY_TRACT | Status: AC
  Administered 2016-12-19: 2 via RESPIRATORY_TRACT
  Filled 2016-12-19: qty 6.7

## 2016-12-19 NOTE — ED Provider Notes (Signed)
TIME SEEN: 1:32 AM  CHIEF COMPLAINT: Shortness of breath  HPI: Patient is a 31 year old male with history of asthma who presents emergency department with shortness of breath, wheezing, past 3 days. No fevers, cough, chest pain, lower extremity pain or swelling. No history of PE or DVT. He has never been admitted to the hospital before for his asthma or intubated. States he is out of his albuterol inhaler and at this time does not have insurance and cannot afford a refill. Wife reports he has been wheezing mostly at night when he is sleeping.  ROS: See HPI Constitutional: no fever  Eyes: no drainage  ENT: no runny nose   Cardiovascular:  no chest pain  Resp:  SOB  GI: no vomiting GU: no dysuria Integumentary: no rash  Allergy: no hives  Musculoskeletal: no leg swelling  Neurological: no slurred speech ROS otherwise negative  PAST MEDICAL HISTORY/PAST SURGICAL HISTORY:  Past Medical History:  Diagnosis Date  . Anxiety   . Asthma   . Seizures (HCC)     MEDICATIONS:  Prior to Admission medications   Medication Sig Start Date End Date Taking? Authorizing Provider  benzonatate (TESSALON) 100 MG capsule Take 1 capsule (100 mg total) by mouth every 8 (eight) hours. 06/02/15   Sam, Ace Gins, PA-C  guaiFENesin (MUCINEX) 600 MG 12 hr tablet Take 1 tablet (600 mg total) by mouth 2 (two) times daily. 06/02/15   Sam, Ace Gins, PA-C  hydrOXYzine (VISTARIL) 50 MG capsule Take 50 mg by mouth 2 (two) times daily as needed for anxiety.    [provider]  naproxen (NAPROSYN) 500 MG tablet Take 1 tablet (500 mg total) by mouth 2 (two) times daily. 03/24/16   Renne Crigler, PA-C  ondansetron (ZOFRAN ODT) 4 MG disintegrating tablet Take 1 tablet (4 mg total) by mouth every 8 (eight) hours as needed for nausea or vomiting. 06/02/15   Sam, Ace Gins, PA-C  oseltamivir (TAMIFLU) 75 MG capsule Take 1 capsule (75 mg total) by mouth every 12 (twelve) hours. 09/12/16   Gerhard Munch, MD     ALLERGIES:  No Known Allergies  SOCIAL HISTORY:  Social History  Substance Use Topics  . Smoking status: Current Every Day Smoker    Packs/day: 0.00    Types: Cigarettes  . Smokeless tobacco: Never Used  . Alcohol use No    FAMILY HISTORY: No family history on file.  EXAM: BP (!) 129/98 (BP Location: Right Arm)   Pulse 78   Temp 97.9 F (36.6 C) (Oral)   Resp 20   Ht 5\' 7"  (1.702 m)   Wt 79.4 kg (175 lb)   SpO2 100%   BMI 27.41 kg/m  CONSTITUTIONAL: Alert and oriented and responds appropriately to questions. Well-appearing; well-nourished HEAD: Normocephalic EYES: Conjunctivae clear, pupils appear equal, EOMI ENT: normal nose; moist mucous membranes NECK: Supple, no meningismus, no nuchal rigidity, no LAD  CARD: RRR; S1 and S2 appreciated; no murmurs, no clicks, no rubs, no gallops RESP: Normal chest excursion without splinting or tachypnea; breath sounds clear and equal bilaterally; no wheezes, no rhonchi, no rales, no hypoxia or respiratory distress, speaking full sentences ABD/GI: Normal bowel sounds; non-distended; soft, non-tender, no rebound, no guarding, no peritoneal signs, no hepatosplenomegaly BACK:  The back appears normal and is non-tender to palpation, there is no CVA tenderness EXT: Normal ROM in all joints; non-tender to palpation; no edema; normal capillary refill; no cyanosis, no calf tenderness or swelling    SKIN: Normal color for  age and race; warm; no rash NEURO: Moves all extremities equally PSYCH: The patient's mood and manner are appropriate. Grooming and personal hygiene are appropriate.  MEDICAL DECISION MAKING: Patient here with asthma exacerbation. Currently his lungs are completely clear with great aeration, no hypoxia. He has no respiratory distress speaking full sentences. Chest x-ray ordered in triage is unremarkable. Doubt ACS, PE, dissection. Will provide him with albuterol inhaler here from the emergency department and prescription for  the same with refills. At this time I do not feel he needs to be on steroids. I do not feel he needs antibiotics. Patient comfortable with this plan. Discussed return precautions and have provided him with outpatient follow-up.   At this time, I do not feel there is any life-threatening condition present. I have reviewed and discussed all results (EKG, imaging, lab, urine as appropriate) and exam findings with patient/family. I have reviewed nursing notes and appropriate previous records.  I feel the patient is safe to be discharged home without further emergent workup and can continue workup as an outpatient as needed. Discussed usual and customary return precautions. Patient/family verbalize understanding and are comfortable with this plan.  Outpatient follow-up has been provided if needed. All questions have been answered.      Demetrion Wesby, Layla MawKristen N, DO 12/19/16 [phone]

## 2016-12-19 NOTE — ED Triage Notes (Signed)
Pt c/o increased SOB for the last three days, hx of asthma but no inhaler at home, no fevers, lungs clear, pt c/o inability to take a deep breath

## 2017-02-23 ENCOUNTER — Encounter (HOSPITAL_BASED_OUTPATIENT_CLINIC_OR_DEPARTMENT_OTHER): Payer: Self-pay | Admitting: Emergency Medicine

## 2017-02-23 ENCOUNTER — Emergency Department (HOSPITAL_BASED_OUTPATIENT_CLINIC_OR_DEPARTMENT_OTHER)
Admission: EM | Admit: 2017-02-23 | Discharge: 2017-02-23 | Disposition: A | Discharge status: Home / Self Care | Payer: Self-pay | Attending: Physician Assistant | Admitting: Physician Assistant

## 2017-02-23 DIAGNOSIS — J45909 Unspecified asthma, uncomplicated: Secondary | ICD-10-CM | POA: Insufficient documentation

## 2017-02-23 DIAGNOSIS — F17210 Nicotine dependence, cigarettes, uncomplicated: Secondary | ICD-10-CM | POA: Insufficient documentation

## 2017-02-23 DIAGNOSIS — Z79899 Other long term (current) drug therapy: Secondary | ICD-10-CM | POA: Insufficient documentation

## 2017-02-23 DIAGNOSIS — G5603 Carpal tunnel syndrome, bilateral upper limbs: Secondary | ICD-10-CM | POA: Insufficient documentation

## 2017-02-23 NOTE — ED Provider Notes (Signed)
[facility] Provider Note   CSN: [id number] Arrival date & time: 02/23/17  1446     History   Chief Complaint Chief Complaint  Patient presents with  . Wrist Pain    HPI Miguel Gallegos is a 31 y.o. male.  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 He is presenting with pain in right and left hand. He reports the tingling sensation in his right hand. He said before he has been diagnosed with carpal tunnel but is never done anything about it. He said his boss told him he thinks that is what it is. Patient says it's worse after sleeping ior lifting heavy things. He has numbness tingling and pain. No trauma.  HPI  Past Medical History:  Diagnosis Date  . Anxiety   . Asthma   . Seizures (HCC)     There are no active problems to display for this patient.   History reviewed. No pertinent surgical history.     Home Medications    Prior to Admission medications   Medication Sig Start Date End Date Taking? Authorizing Provider  albuterol (PROVENTIL HFA;VENTOLIN HFA) 108 (90 Base) MCG/ACT inhaler Inhale 2 puffs into the lungs every 4 (four) hours as needed for wheezing or shortness of breath. 12/19/16   Ward, Layla Maw, DO  benzonatate (TESSALON) 100 MG capsule Take 1 capsule (100 mg total) by mouth every 8 (eight) hours. 06/02/15   Sam, Ace Gins, PA-C  guaiFENesin (MUCINEX) 600 MG 12 hr tablet Take 1 tablet (600 mg total) by mouth 2 (two) times daily. 06/02/15   Sam, Ace Gins, PA-C  hydrOXYzine (VISTARIL) 50 MG capsule Take 50 mg by mouth 2 (two) times daily as needed for anxiety.    [provider]  naproxen (NAPROSYN) 500 MG tablet Take 1 tablet (500 mg total) by mouth 2 (two) times daily. 03/24/16   Renne Crigler, PA-C  ondansetron (ZOFRAN ODT) 4 MG disintegrating tablet Take 1 tablet (4 mg total) by mouth every 8 (eight) hours as needed for nausea or vomiting. 06/02/15   Sam, Ace Gins, PA-C  oseltamivir (TAMIFLU) 75 MG capsule Take 1 capsule (75 mg  total) by mouth every 12 (twelve) hours. 09/12/16   Gerhard Munch, MD    Family History History reviewed. No pertinent family history.  Social History Social History  Substance Use Topics  . Smoking status: Current Every Day Smoker    Packs/day: 0.00    Types: Cigarettes  . Smokeless tobacco: Never Used  . Alcohol use No     Allergies   Patient has no known allergies.   Review of Systems Review of Systems  Constitutional: Negative for activity change.  Respiratory: Negative for shortness of breath.   Cardiovascular: Negative for chest pain.  Gastrointestinal: Negative for abdominal pain.     Physical Exam Updated Vital Signs BP (!) 144/75 (BP Location: Right Arm)   Pulse 99   Temp 98.5 F (36.9 C) (Oral)   Resp 18   Ht 5\' 7"  (1.702 m)   Wt 79.4 kg (175 lb)   SpO2 100%   BMI 27.41 kg/m   Physical Exam  Constitutional: He is oriented to person, place, and time. He appears well-nourished.  HENT:  Head: Normocephalic.  Eyes: Conjunctivae are normal.  Cardiovascular: Normal rate.   Pulmonary/Chest: Effort normal.  Musculoskeletal:  R wrist with negative for extra signs. Tinel elicts pain   Neurological: He is oriented to person, place, and time.  Skin: Skin is warm  and dry. He is not diaphoretic.  Psychiatric: He has a normal mood and affect. His behavior is normal.     ED Treatments / Results  Labs (all labs ordered are listed, but only abnormal results are displayed) Labs Reviewed - No data to display  EKG  EKG Interpretation None       Radiology No results found.  Procedures Procedures (including critical care time)  Medications Ordered in ED Medications - No data to display   Initial Impression / Assessment and Plan / ED Course  I have reviewed the triage vital signs and the nursing notes.  Pertinent labs & imaging results that were available during my care of the patient were reviewed by me and considered in my medical decision  making (see chart for details).     Patient likely has carpal tunnel from overuse injury. We'll give bilateral wrist braces as first line therapy. We'll give him follow-up with orthopedics. Discussed return precautions and the diagnosis.  Final Clinical Impressions(s) / ED Diagnoses   Final diagnoses:  None    New Prescriptions New Prescriptions   No medications on file     Abelino DerrickMackuen, Marlys Stegmaier Lyn, MD 02/23/17 1729

## 2017-02-23 NOTE — ED Triage Notes (Signed)
Patient reports that he has pain to his right wrist intermittently for  "awhile" - the pateint reports that today is has become worse and he thinks "I have carpal tunnel syndrome

## 2017-02-23 NOTE — Discharge Instructions (Signed)
Please use these wrist braces for the next 6 weeks. Especially while sleeping. Please follow-up as needed.

## 2017-03-10 ENCOUNTER — Emergency Department (HOSPITAL_COMMUNITY): Payer: Self-pay

## 2017-03-10 ENCOUNTER — Encounter (HOSPITAL_COMMUNITY): Payer: Self-pay | Admitting: *Deleted

## 2017-03-10 ENCOUNTER — Emergency Department (HOSPITAL_COMMUNITY)
Admission: EM | Admit: 2017-03-10 | Discharge: 2017-03-11 | Disposition: A | Discharge status: Home / Self Care | Payer: Self-pay | Attending: Emergency Medicine | Admitting: Emergency Medicine

## 2017-03-10 DIAGNOSIS — F17210 Nicotine dependence, cigarettes, uncomplicated: Secondary | ICD-10-CM | POA: Insufficient documentation

## 2017-03-10 DIAGNOSIS — N23 Unspecified renal colic: Secondary | ICD-10-CM

## 2017-03-10 DIAGNOSIS — N2889 Other specified disorders of kidney and ureter: Secondary | ICD-10-CM

## 2017-03-10 DIAGNOSIS — J45909 Unspecified asthma, uncomplicated: Secondary | ICD-10-CM | POA: Insufficient documentation

## 2017-03-10 DIAGNOSIS — R10A2 Flank pain, left side: Secondary | ICD-10-CM

## 2017-03-10 DIAGNOSIS — R1032 Left lower quadrant pain: Secondary | ICD-10-CM | POA: Insufficient documentation

## 2017-03-10 DIAGNOSIS — Z79899 Other long term (current) drug therapy: Secondary | ICD-10-CM | POA: Insufficient documentation

## 2017-03-10 DIAGNOSIS — R109 Unspecified abdominal pain: Secondary | ICD-10-CM

## 2017-03-10 LAB — LIPASE, BLOOD: Lipase: 32 U/L (ref 11–51)

## 2017-03-10 LAB — CBC
HCT: 46.5 % (ref 39.0–52.0)
Hemoglobin: 15.3 g/dL (ref 13.0–17.0)
MCH: 27.4 pg (ref 26.0–34.0)
MCHC: 32.9 g/dL (ref 30.0–36.0)
MCV: 83.3 fL (ref 78.0–100.0)
PLATELETS: 236 10*3/uL (ref 150–400)
RBC: 5.58 MIL/uL (ref 4.22–5.81)
RDW: 13.4 % (ref 11.5–15.5)
WBC: 10.5 10*3/uL (ref 4.0–10.5)

## 2017-03-10 LAB — URINALYSIS, ROUTINE W REFLEX MICROSCOPIC
Bilirubin Urine: NEGATIVE
Glucose, UA: NEGATIVE mg/dL
Hgb urine dipstick: NEGATIVE
KETONES UR: NEGATIVE mg/dL
LEUKOCYTES UA: NEGATIVE
NITRITE: NEGATIVE
PROTEIN: NEGATIVE mg/dL
Specific Gravity, Urine: 1.024 (ref 1.005–1.030)
pH: 5 (ref 5.0–8.0)

## 2017-03-10 LAB — COMPREHENSIVE METABOLIC PANEL
ALT: 70 U/L — ABNORMAL HIGH (ref 17–63)
AST: 30 U/L (ref 15–41)
Albumin: 4.3 g/dL (ref 3.5–5.0)
Alkaline Phosphatase: 76 U/L (ref 38–126)
Anion gap: 7 (ref 5–15)
BILIRUBIN TOTAL: 0.6 mg/dL (ref 0.3–1.2)
BUN: 16 mg/dL (ref 6–20)
CO2: 25 mmol/L (ref 22–32)
CREATININE: 1.08 mg/dL (ref 0.61–1.24)
Calcium: 9.5 mg/dL (ref 8.9–10.3)
Chloride: 106 mmol/L (ref 101–111)
Glucose, Bld: 99 mg/dL (ref 65–99)
POTASSIUM: 4.6 mmol/L (ref 3.5–5.1)
Sodium: 138 mmol/L (ref 135–145)
TOTAL PROTEIN: 7 g/dL (ref 6.5–8.1)

## 2017-03-10 MED ORDER — ONDANSETRON 4 MG PO TBDP
8.0000 mg | ORAL_TABLET | Freq: Once | ORAL | Status: AC
  Administered 2017-03-10: 8 mg via ORAL
  Filled 2017-03-10: qty 2

## 2017-03-10 MED ORDER — MORPHINE SULFATE (PF) 4 MG/ML IV SOLN
8.0000 mg | Freq: Once | INTRAVENOUS | Status: AC
  Administered 2017-03-10: 8 mg via INTRAVENOUS
  Filled 2017-03-10: qty 2

## 2017-03-10 MED ORDER — KETOROLAC TROMETHAMINE 30 MG/ML IJ SOLN
15.0000 mg | Freq: Once | INTRAMUSCULAR | Status: AC
  Administered 2017-03-10: 15 mg via INTRAVENOUS
  Filled 2017-03-10: qty 1

## 2017-03-10 MED ORDER — OXYCODONE-ACETAMINOPHEN 5-325 MG PO TABS
1.0000 | ORAL_TABLET | Freq: Once | ORAL | Status: AC
  Administered 2017-03-10: 1 via ORAL
  Filled 2017-03-10: qty 1

## 2017-03-10 NOTE — ED Provider Notes (Signed)
[facility]-EMER[facility]ENCY DEPT Provider Note   CSN: [id number] Arrival date & time: 03/10/17  1939     History   Chief Complaint Chief Complaint  Patient presents with  . Flank Pain    HPI Miguel Gallegos is a 31 y.o. male.  HPI  31 year old male with a history of asthma as well as seizures, most recent seizure 10 years ago, presents with left flank pain. He states it started 2 days ago and has worsened tonight. He states that it was intermittent but now over the last few hours is more constant. Now the pain is both left flank and LLQ. No injury. At first laying flat with seem to help the pain but now he cannot find a comfortable position. He has taken ibuprofen, Tylenol, and Aleve without relief. No nausea or vomiting or fevers. No dysuria but he seems to be frequently urinating. No penile pain or testicular pain. No prior history of kidney stones. No discharge. Pain is a 10/10.  Past Medical History:  Diagnosis Date  . Anxiety   . Asthma   . Seizures (HCC)     There are no active problems to display for this patient.   History reviewed. No pertinent surgical history.     Home Medications    Prior to Admission medications   Medication Sig Start Date End Date Taking? Authorizing Provider  albuterol (PROVENTIL HFA;VENTOLIN HFA) 108 (90 Base) MCG/ACT inhaler Inhale 2 puffs into the lungs every 4 (four) hours as needed for wheezing or shortness of breath. 12/19/16  Yes Ward, Layla Maw, DO  benzonatate (TESSALON) 100 MG capsule Take 1 capsule (100 mg total) by mouth every 8 (eight) hours. Patient not taking: Reported on 03/10/2017 06/02/15   Sam, Ace Gins, PA-C  guaiFENesin (MUCINEX) 600 MG 12 hr tablet Take 1 tablet (600 mg total) by mouth 2 (two) times daily. Patient not taking: Reported on 03/10/2017 06/02/15   Carlene Coria, PA-C  HYDROcodone-acetaminophen [facility]) 5-325 MG tablet Take 1-2 tablets by mouth every 4 (four) hours as needed for severe pain. 03/11/17   Pricilla Loveless, MD    ibuprofen (ADVIL,MOTRIN) 600 MG tablet Take 1 tablet (600 mg total) by mouth every 8 (eight) hours as needed. 03/11/17   Pricilla Loveless, MD  naproxen (NAPROSYN) 500 MG tablet Take 1 tablet (500 mg total) by mouth 2 (two) times daily. Patient not taking: Reported on 03/10/2017 03/24/16   Renne Crigler, PA-C  ondansetron (ZOFRAN ODT) 4 MG disintegrating tablet Take 1 tablet (4 mg total) by mouth every 8 (eight) hours as needed for nausea or vomiting. Patient not taking: Reported on 03/10/2017 06/02/15   Carlene Coria, PA-C  oseltamivir (TAMIFLU) 75 MG capsule Take 1 capsule (75 mg total) by mouth every 12 (twelve) hours. Patient not taking: Reported on 03/10/2017 09/12/16   Gerhard Munch, MD    Family History No family history on file.  Social History Social History  Substance Use Topics  . Smoking status: Current Every Day Smoker    Packs/day: 0.00    Types: Cigarettes  . Smokeless tobacco: Never Used  . Alcohol use No     Allergies   Patient has no known allergies.   Review of Systems Review of Systems  Constitutional: Negative for fever.  Gastrointestinal: Positive for abdominal pain. Negative for nausea and vomiting.  Genitourinary: Positive for flank pain and frequency. Negative for decreased urine volume, discharge, dysuria, penile pain, penile swelling, scrotal swelling and testicular pain.  All other systems reviewed and are  negative.    Physical Exam Updated Vital Signs BP 124/87   Pulse 82   Temp 98 F (36.7 C) (Oral)   Resp 19   Ht 5\' 6"  (1.676 m)   Wt 77.1 kg (170 lb)   SpO2 98%   BMI 27.44 kg/m   Physical Exam  Constitutional: He is oriented to person, place, and time. He appears well-developed and well-nourished.  HENT:  Head: Normocephalic and atraumatic.  Right Ear: External ear normal.  Left Ear: External ear normal.  Nose: Nose normal.  Eyes: Right eye exhibits no discharge. Left eye exhibits no discharge.  Neck: Neck supple.  Cardiovascular:  Normal rate, regular rhythm and normal heart sounds.   Pulmonary/Chest: Effort normal and breath sounds normal.  Abdominal: Soft. He exhibits no distension. There is no tenderness. There is no CVA tenderness.  Genitourinary: Testes normal and penis normal. Right testis shows no swelling and no tenderness. Left testis shows no swelling and no tenderness. Circumcised. No penile erythema or penile tenderness.  Musculoskeletal: He exhibits no edema.       Thoracic back: He exhibits no tenderness and no bony tenderness.       Lumbar back: He exhibits no tenderness and no bony tenderness.  Neurological: He is alert and oriented to person, place, and time.  Skin: Skin is warm and dry. He is not diaphoretic.  Nursing note and vitals reviewed.    ED Treatments / Results  Labs (all labs ordered are listed, but only abnormal results are displayed) Labs Reviewed  COMPREHENSIVE METABOLIC PANEL - Abnormal; Notable for the following:       Result Value   ALT 70 (*)    All other components within normal limits  LIPASE, BLOOD  CBC  URINALYSIS, ROUTINE W REFLEX MICROSCOPIC    EKG  EKG Interpretation None       Radiology US Renal  Result Date: 03/11/2017 CLINICAL DATA:  LEFT flank pain for 3 days, urinary frequency. Assess for kidney stones. EXAM: RENAL / URINARY TRACT ULTRASOUND COMPLETE COMPARISON:  None. FINDINGS: Right Kidney: Length: 11.6 cm. Echogenicity within normal limits. No mass or hydronephrosis visualized. Left Kidney: Length: 12.4 cm. Echogenicity within normal limits. No mass or hydronephrosis visualized. Mild LEFT pelviectasis. Bladder: Appears normal for degree of bladder distention. IMPRESSION: LEFT pelviectasis without frank hydronephrosis. Electronically Signed   By: Awilda Metro M.D.   On: 03/11/2017 00:01    Procedures Procedures (including critical care time)  Medications Ordered in ED Medications  ondansetron (ZOFRAN-ODT) disintegrating tablet 8 mg (8 mg Oral  Given 03/10/17 2016)  oxyCODONE-acetaminophen (PERCOCET/ROXICET) 5-325 MG per tablet 1 tablet (1 tablet Oral Given 03/10/17 2016)  morphine 4 MG/ML injection 8 mg (8 mg Intravenous Given 03/10/17 2244)  ketorolac (TORADOL) 30 MG/ML injection 15 mg (15 mg Intravenous Given 03/10/17 2239)     Initial Impression / Assessment and Plan / ED Course  I have reviewed the triage vital signs and the nursing notes.  Pertinent labs & imaging results that were available during my care of the patient were reviewed by me and considered in my medical decision making (see chart for details).     Pain is now minimal after a dose of morphine and Toradol. While he does not have hematuria, his history is most consistent with ureteral colic. Labs are unremarkable. He is afebrile and no vomiting. Ultrasound shows mild pelviectasis but otherwise no significant abnormalities. This is still probably likely due to stone and think he can be treated as  such. Follow-up with urology. I do not think CT is warranted at this time given my suspicion for other acute intra-abdominal or retroperitoneal emergency is low. However I discussed strict return precautions.  Final Clinical Impressions(s) / ED Diagnoses   Final diagnoses:  Left flank pain  Ureteral colic  Pelviectasis    New Prescriptions New Prescriptions   HYDROCODONE-ACETAMINOPHEN (NORCO) 5-325 MG TABLET    Take 1-2 tablets by mouth every 4 (four) hours as needed for severe pain.   IBUPROFEN (ADVIL,MOTRIN) 600 MG TABLET    Take 1 tablet (600 mg total) by mouth every 8 (eight) hours as needed.     Pricilla Loveless, MD 03/11/17 Jacinta Shoe

## 2017-03-10 NOTE — ED Notes (Signed)
Pt in US at this time 

## 2017-03-10 NOTE — ED Triage Notes (Signed)
The pt is c/o lt flank pain since last pm  He has had urinary frequency today  No bloody urine

## 2017-03-11 MED ORDER — IBUPROFEN 600 MG PO TABS: 600.0000 mg | ORAL_TABLET | Freq: Three times a day (TID) | ORAL | 0 refills | Status: DC | PRN

## 2017-03-11 MED ORDER — HYDROCODONE-ACETAMINOPHEN 5-325 MG PO TABS: 1.0000 | ORAL_TABLET | ORAL | 0 refills | Status: DC | PRN

## 2017-03-11 NOTE — ED Notes (Signed)
ED Provider at bedside. 

## 2017-03-12 ENCOUNTER — Emergency Department (HOSPITAL_COMMUNITY): Payer: Self-pay

## 2017-03-12 ENCOUNTER — Emergency Department (HOSPITAL_COMMUNITY)
Admission: EM | Admit: 2017-03-12 | Discharge: 2017-03-12 | Disposition: A | Discharge status: Home / Self Care | Payer: Self-pay | Attending: Emergency Medicine | Admitting: Emergency Medicine

## 2017-03-12 ENCOUNTER — Encounter (HOSPITAL_COMMUNITY): Payer: Self-pay | Admitting: Emergency Medicine

## 2017-03-12 DIAGNOSIS — N200 Calculus of kidney: Secondary | ICD-10-CM | POA: Insufficient documentation

## 2017-03-12 DIAGNOSIS — F17210 Nicotine dependence, cigarettes, uncomplicated: Secondary | ICD-10-CM | POA: Insufficient documentation

## 2017-03-12 DIAGNOSIS — J45909 Unspecified asthma, uncomplicated: Secondary | ICD-10-CM | POA: Insufficient documentation

## 2017-03-12 LAB — CBC
HCT: 46.8 % (ref 39.0–52.0)
HEMOGLOBIN: 15.8 g/dL (ref 13.0–17.0)
MCH: 28.3 pg (ref 26.0–34.0)
MCHC: 33.8 g/dL (ref 30.0–36.0)
MCV: 83.9 fL (ref 78.0–100.0)
PLATELETS: 209 10*3/uL (ref 150–400)
RBC: 5.58 MIL/uL (ref 4.22–5.81)
RDW: 13 % (ref 11.5–15.5)
WBC: 10.4 10*3/uL (ref 4.0–10.5)

## 2017-03-12 LAB — BASIC METABOLIC PANEL
Anion gap: 7 (ref 5–15)
BUN: 20 mg/dL (ref 6–20)
CALCIUM: 9.3 mg/dL (ref 8.9–10.3)
CHLORIDE: 104 mmol/L (ref 101–111)
CO2: 26 mmol/L (ref 22–32)
CREATININE: 1.33 mg/dL — AB (ref 0.61–1.24)
GFR calc non Af Amer: >60 mL/min (ref >60)
Glucose, Bld: 108 mg/dL — ABNORMAL HIGH (ref 65–99)
Potassium: 4.5 mmol/L (ref 3.5–5.1)
SODIUM: 137 mmol/L (ref 135–145)

## 2017-03-12 LAB — URINALYSIS, ROUTINE W REFLEX MICROSCOPIC
BILIRUBIN URINE: NEGATIVE
GLUCOSE, UA: NEGATIVE mg/dL
HGB URINE DIPSTICK: NEGATIVE
KETONES UR: NEGATIVE mg/dL
Leukocytes, UA: NEGATIVE
Nitrite: NEGATIVE
PH: 5 (ref 5.0–8.0)
PROTEIN: NEGATIVE mg/dL
SPECIFIC GRAVITY, URINE: 1.03 (ref 1.005–1.030)

## 2017-03-12 MED ORDER — OXYCODONE-ACETAMINOPHEN 5-325 MG PO TABS
1.0000 | ORAL_TABLET | ORAL | Status: DC | PRN
  Administered 2017-03-12: 1 via ORAL

## 2017-03-12 MED ORDER — KETOROLAC TROMETHAMINE 30 MG/ML IJ SOLN
30.0000 mg | Freq: Once | INTRAMUSCULAR | Status: AC
  Administered 2017-03-12: 30 mg via INTRAVENOUS
  Filled 2017-03-12: qty 1

## 2017-03-12 MED ORDER — KETOROLAC TROMETHAMINE 10 MG PO TABS
10.0000 mg | ORAL_TABLET | Freq: Four times a day (QID) | ORAL | 0 refills | Status: DC | PRN
Start: 2017-03-12 — End: 2017-12-01

## 2017-03-12 MED ORDER — OXYCODONE-ACETAMINOPHEN 5-325 MG PO TABS
ORAL_TABLET | ORAL | Status: AC
  Filled 2017-03-12: qty 1

## 2017-03-12 NOTE — ED Notes (Signed)
Patient transported to CT 

## 2017-03-12 NOTE — ED Provider Notes (Signed)
[facility] DEPT Provider Note   CSN: [id number] Arrival date & time: 03/12/17  1041     History   Chief Complaint Chief Complaint  Patient presents with  . Flank Pain    HPI Miguel Gallegos is a 31 y.o. male.  HPI   31 year old male presents today with left flank pain.  Patient was seen 2 days ago with left flank pain.  Patient notes symptoms started 6 days ago with left flank pain that radiates to his left lower abdomen.  Patient notes this is sharp in nature.  He denies any nausea vomiting, fever chills, dysuria, or any other systemic complaints.  Patient notes he was seen in the emergency room diagnosed with likely kidney stone and discharged home with pain medication.  He notes he has been unable to afford pain medication, but also notes he did not call to see how much it would cost.  Patient reports continued pain.  No history of kidney stones in the past.    Past Medical History:  Diagnosis Date  . Anxiety   . Asthma   . Seizures (HCC)     There are no active problems to display for this patient.   History reviewed. No pertinent surgical history.     Home Medications    Prior to Admission medications   Medication Sig Start Date End Date Taking? Authorizing Provider  albuterol (PROVENTIL HFA;VENTOLIN HFA) 108 (90 Base) MCG/ACT inhaler Inhale 2 puffs into the lungs every 4 (four) hours as needed for wheezing or shortness of breath. 12/19/16   Ward, Layla Maw, DO  benzonatate (TESSALON) 100 MG capsule Take 1 capsule (100 mg total) by mouth every 8 (eight) hours. Patient not taking: Reported on 03/10/2017 06/02/15   Sam, Ace Gins, PA-C  guaiFENesin (MUCINEX) 600 MG 12 hr tablet Take 1 tablet (600 mg total) by mouth 2 (two) times daily. Patient not taking: Reported on 03/10/2017 06/02/15   Carlene Coria, PA-C  HYDROcodone-acetaminophen [facility]) 5-325 MG tablet Take 1-2 tablets by mouth every 4 (four) hours as needed for severe pain. 03/11/17   Pricilla Loveless, MD    ibuprofen (ADVIL,MOTRIN) 600 MG tablet Take 1 tablet (600 mg total) by mouth every 8 (eight) hours as needed. 03/11/17   Pricilla Loveless, MD  ketorolac (TORADOL) 10 MG tablet Take 1 tablet (10 mg total) by mouth every 6 (six) hours as needed. 03/12/17   Leshawn Houseworth, Tinnie Gens, PA-C  naproxen (NAPROSYN) 500 MG tablet Take 1 tablet (500 mg total) by mouth 2 (two) times daily. Patient not taking: Reported on 03/10/2017 03/24/16   Renne Crigler, PA-C  ondansetron (ZOFRAN ODT) 4 MG disintegrating tablet Take 1 tablet (4 mg total) by mouth every 8 (eight) hours as needed for nausea or vomiting. Patient not taking: Reported on 03/10/2017 06/02/15   Carlene Coria, PA-C  oseltamivir (TAMIFLU) 75 MG capsule Take 1 capsule (75 mg total) by mouth every 12 (twelve) hours. Patient not taking: Reported on 03/10/2017 09/12/16   Gerhard Munch, MD    Family History History reviewed. No pertinent family history.  Social History Social History  Substance Use Topics  . Smoking status: Current Every Day Smoker    Packs/day: 0.00    Types: Cigarettes  . Smokeless tobacco: Never Used  . Alcohol use Yes     Comment: occa     Allergies   Patient has no known allergies.   Review of Systems Review of Systems  All other systems reviewed and are negative.  Physical Exam Updated Vital Signs BP 118/82   Pulse 80   Temp 98.5 F (36.9 C) (Oral)   Resp 17   Ht 5\' 6"  (1.676 m)   Wt 77.1 kg (170 lb)   SpO2 99%   BMI 27.44 kg/m   Physical Exam  Constitutional: He is oriented to person, place, and time. He appears well-developed and well-nourished.  HENT:  Head: Normocephalic and atraumatic.  Eyes: Pupils are equal, round, and reactive to light. Conjunctivae are normal. Right eye exhibits no discharge. Left eye exhibits no discharge. No scleral icterus.  Neck: Normal range of motion. No JVD present. No tracheal deviation present.  Pulmonary/Chest: Effort normal. No stridor.  Abdominal: Soft. He exhibits no  distension and no mass. There is no tenderness. There is no rebound and no guarding. No hernia.  No CVA tenderness  Neurological: He is alert and oriented to person, place, and time. Coordination normal.  Psychiatric: He has a normal mood and affect. His behavior is normal. Judgment and thought content normal.  Nursing note and vitals reviewed.    ED Treatments / Results  Labs (all labs ordered are listed, but only abnormal results are displayed) Labs Reviewed  BASIC METABOLIC PANEL - Abnormal; Notable for the following:       Result Value   Glucose, Bld 108 (*)    Creatinine, Ser 1.33 (*)    All other components within normal limits  URINALYSIS, ROUTINE W REFLEX MICROSCOPIC  CBC    EKG  EKG Interpretation None       Radiology US Renal  Result Date: 03/11/2017 CLINICAL DATA:  LEFT flank pain for 3 days, urinary frequency. Assess for kidney stones. EXAM: RENAL / URINARY TRACT ULTRASOUND COMPLETE COMPARISON:  None. FINDINGS: Right Kidney: Length: 11.6 cm. Echogenicity within normal limits. No mass or hydronephrosis visualized. Left Kidney: Length: 12.4 cm. Echogenicity within normal limits. No mass or hydronephrosis visualized. Mild LEFT pelviectasis. Bladder: Appears normal for degree of bladder distention. IMPRESSION: LEFT pelviectasis without frank hydronephrosis. Electronically Signed   By: Awilda Metro M.D.   On: 03/11/2017 00:01   Ct Renal Stone Study  Result Date: 03/12/2017 CLINICAL DATA:  Left flank pain for 3 days EXAM: CT ABDOMEN AND PELVIS WITHOUT CONTRAST TECHNIQUE: Multidetector CT imaging of the abdomen and pelvis was performed following the standard protocol without IV contrast. COMPARISON:  Ultrasound 03/10/2017 FINDINGS: Lower chest: Lung bases are clear. No effusions. Heart is normal size. Hepatobiliary: No focal hepatic abnormality. Gallbladder unremarkable. Pancreas: No focal abnormality or ductal dilatation. Spleen: No focal abnormality.  Normal size.  Adrenals/Urinary Tract: Punctate 1-2 mm left UVJ stone with mild left hydronephrosis. No stones or hydronephrosis on the right. Adrenal glands and urinary bladder unremarkable. Stomach/Bowel: Stomach, large and small bowel grossly unremarkable. Appendix is normal. Vascular/Lymphatic: No evidence of aneurysm or adenopathy. Reproductive: No visible focal abnormality. Other: No free fluid or free air. Musculoskeletal: No acute bony abnormality. IMPRESSION: 1-2 mm distal left ureteral stone with mild left hydronephrosis. Electronically Signed   By: Charlett Nose M.D.   On: 03/12/2017 15:21    Procedures Procedures (including critical care time)  Medications Ordered in ED Medications  oxyCODONE-acetaminophen (PERCOCET/ROXICET) 5-325 MG per tablet 1 tablet (1 tablet Oral Given 03/12/17 1049)  oxyCODONE-acetaminophen (PERCOCET/ROXICET) 5-325 MG per tablet (not administered)  ketorolac (TORADOL) 30 MG/ML injection 30 mg (30 mg Intravenous Given 03/12/17 1557)     Initial Impression / Assessment and Plan / ED Course  I have reviewed the triage vital  signs and the nursing notes.  Pertinent labs & imaging results that were available during my care of the patient were reviewed by me and considered in my medical decision making (see chart for details).    31 year old male presents with ureterolithiasis.  Patient has mild left-sided hydronephrosis.  Patient also with mild elevation in creatinine.  This is a small 1-2 mm stone and likely will pass without significant intervention.  Patient has no signs of infectious etiology.  He will take pain medication at home, follow-up with urology.  He is given strict return precautions, he verbalized understanding and agreement to today's plan had no further questions or concerns at time discharge  Final Clinical Impressions(s) / ED Diagnoses   Final diagnoses:  Nephrolithiasis    New Prescriptions New Prescriptions   KETOROLAC (TORADOL) 10 MG TABLET    Take 1  tablet (10 mg total) by mouth every 6 (six) hours as needed.     Eyvonne Mechanic, PA-C 03/12/17 1730    Mancel Bale, MD 03/13/17 [phone]

## 2017-03-12 NOTE — ED Notes (Signed)
Pt. Returned from CT via stretcher. 

## 2017-03-12 NOTE — Discharge Instructions (Signed)
Please read attached information. If you experience any new or worsening signs or symptoms please return to the emergency room for evaluation. Please follow-up with your primary care provider or specialist as discussed. Please use medication prescribed only as directed and discontinue taking if you have any concerning signs or symptoms.   °

## 2017-03-12 NOTE — ED Triage Notes (Signed)
Pt states he was here a few days ago with kidney stones. He has not passed them yet and is doubled over in pain. States it is unbearable. Denies bloody urine. Pt states he does not have the money to pay for the pain medication he was  prescribed.

## 2017-12-01 ENCOUNTER — Encounter (HOSPITAL_COMMUNITY): Payer: Self-pay | Admitting: Emergency Medicine

## 2017-12-01 ENCOUNTER — Other Ambulatory Visit: Payer: Self-pay

## 2017-12-01 ENCOUNTER — Emergency Department (HOSPITAL_COMMUNITY): Payer: Self-pay

## 2017-12-01 ENCOUNTER — Emergency Department (HOSPITAL_COMMUNITY)
Admission: EM | Admit: 2017-12-01 | Discharge: 2017-12-01 | Disposition: A | Discharge status: Home / Self Care | Payer: Self-pay | Attending: Emergency Medicine | Admitting: Emergency Medicine

## 2017-12-01 DIAGNOSIS — F17210 Nicotine dependence, cigarettes, uncomplicated: Secondary | ICD-10-CM | POA: Insufficient documentation

## 2017-12-01 DIAGNOSIS — Y939 Activity, unspecified: Secondary | ICD-10-CM | POA: Insufficient documentation

## 2017-12-01 DIAGNOSIS — Y998 Other external cause status: Secondary | ICD-10-CM | POA: Insufficient documentation

## 2017-12-01 DIAGNOSIS — J45909 Unspecified asthma, uncomplicated: Secondary | ICD-10-CM | POA: Insufficient documentation

## 2017-12-01 DIAGNOSIS — Z79899 Other long term (current) drug therapy: Secondary | ICD-10-CM | POA: Insufficient documentation

## 2017-12-01 DIAGNOSIS — Y929 Unspecified place or not applicable: Secondary | ICD-10-CM | POA: Insufficient documentation

## 2017-12-01 DIAGNOSIS — W010XXA Fall on same level from slipping, tripping and stumbling without subsequent striking against object, initial encounter: Secondary | ICD-10-CM | POA: Insufficient documentation

## 2017-12-01 DIAGNOSIS — S93401A Sprain of unspecified ligament of right ankle, initial encounter: Secondary | ICD-10-CM

## 2017-12-01 MED ORDER — IBUPROFEN 800 MG PO TABS: 800.0000 mg | ORAL_TABLET | Freq: Three times a day (TID) | ORAL | 0 refills | Status: DC

## 2017-12-01 MED ORDER — TRAMADOL HCL 50 MG PO TABS: 50.0000 mg | ORAL_TABLET | Freq: Four times a day (QID) | ORAL | 0 refills | Status: DC | PRN

## 2017-12-01 MED ORDER — HYDROCODONE-ACETAMINOPHEN 5-325 MG PO TABS
1.0000 | ORAL_TABLET | Freq: Once | ORAL | Status: AC
  Administered 2017-12-01: 1 via ORAL
  Filled 2017-12-01: qty 1

## 2017-12-01 NOTE — ED Triage Notes (Signed)
Pt has hx of right ankle injury/surgery, he re-injured it this week and now has pain shooting up his leg.

## 2017-12-01 NOTE — ED Provider Notes (Signed)
[facility] EMERGENCY DEPARTMENT Provider Note   CSN: [id number] Arrival date & time: 12/01/17  0022     History   Chief Complaint Chief Complaint  Patient presents with  . Ankle Pain    HPI Miguel Gallegos is a 32 y.o. male.  Patient presents to the emergency department for evaluation of ankle injury.  Patient reports a history of ankle injury several years ago that resulted in ligamentous injury and instability.  Since then he has had frequent ankle injuries.  Patient reports that he rolled his ankle several days ago causing pain that radiates up the leg.  He is having trouble bearing weight because of increased pain.     Past Medical History:  Diagnosis Date  . Anxiety   . Asthma   . Seizures (HCC)     There are no active problems to display for this patient.   History reviewed. No pertinent surgical history.      Home Medications    Prior to Admission medications   Medication Sig Start Date End Date Taking? Authorizing Provider  albuterol (PROVENTIL HFA;VENTOLIN HFA) 108 (90 Base) MCG/ACT inhaler Inhale 2 puffs into the lungs every 4 (four) hours as needed for wheezing or shortness of breath. 12/19/16   Ward, Layla Maw, DO  benzonatate (TESSALON) 100 MG capsule Take 1 capsule (100 mg total) by mouth every 8 (eight) hours. Patient not taking: Reported on 03/10/2017 06/02/15   Sam, Ace Gins, PA-C  guaiFENesin (MUCINEX) 600 MG 12 hr tablet Take 1 tablet (600 mg total) by mouth 2 (two) times daily. Patient not taking: Reported on 03/10/2017 06/02/15   Carlene Coria, PA-C  HYDROcodone-acetaminophen [facility]) 5-325 MG tablet Take 1-2 tablets by mouth every 4 (four) hours as needed for severe pain. 03/11/17   Pricilla Loveless, MD  ibuprofen (ADVIL,MOTRIN) 800 MG tablet Take 1 tablet (800 mg total) by mouth 3 (three) times daily. 12/01/17   Gilda Crease, MD  ketorolac (TORADOL) 10 MG tablet Take 1 tablet (10 mg total) by mouth every 6 (six) hours as  needed. 03/12/17   Hedges, Tinnie Gens, PA-C  naproxen (NAPROSYN) 500 MG tablet Take 1 tablet (500 mg total) by mouth 2 (two) times daily. Patient not taking: Reported on 03/10/2017 03/24/16   Renne Crigler, PA-C  ondansetron (ZOFRAN ODT) 4 MG disintegrating tablet Take 1 tablet (4 mg total) by mouth every 8 (eight) hours as needed for nausea or vomiting. Patient not taking: Reported on 03/10/2017 06/02/15   Carlene Coria, PA-C  oseltamivir (TAMIFLU) 75 MG capsule Take 1 capsule (75 mg total) by mouth every 12 (twelve) hours. Patient not taking: Reported on 03/10/2017 09/12/16   Gerhard Munch, MD  traMADol (ULTRAM) 50 MG tablet Take 1 tablet (50 mg total) by mouth every 6 (six) hours as needed. 12/01/17   Gilda Crease, MD    Family History No family history on file.  Social History Social History   Tobacco Use  . Smoking status: Current Every Day Smoker    Packs/day: 0.00    Types: Cigarettes  . Smokeless tobacco: Never Used  Substance Use Topics  . Alcohol use: Yes    Comment: occa  . Drug use: No     Allergies   Patient has no known allergies.   Review of Systems Review of Systems  Musculoskeletal: Positive for arthralgias.  All other systems reviewed and are negative.    Physical Exam Updated Vital Signs BP (!) 135/91 (BP Location: Right Arm)  Pulse 99   Temp 98 F (36.7 C) (Oral)   Resp 18   Ht  (1.702 m)   Wt 93 kg (205 lb)   SpO2 100%   BMI 32.11 kg/m   Physical Exam  Constitutional: He appears well-developed and well-nourished.  HENT:  Head: Atraumatic.  Eyes: Pupils are equal, round, and reactive to light.  Cardiovascular: Normal rate.  Pulmonary/Chest: Breath sounds normal.  Musculoskeletal:       Left ankle: He exhibits no swelling and no deformity. Tenderness. Medial malleolus tenderness found.     ED Treatments / Results  Labs (all labs ordered are listed, but only abnormal results are displayed) Labs Reviewed - No data to  display  EKG None  Radiology Dg Ankle Complete Right  Result Date: 12/01/2017 CLINICAL DATA:  Injury to the right ankle while playing basketball, with medial right ankle pain. Initial encounter. EXAM: RIGHT ANKLE - COMPLETE 3+ VIEW COMPARISON:  None. FINDINGS: There is no evidence of fracture or dislocation. The ankle mortise is intact; the interosseous space is within normal limits. No talar tilt or subluxation is seen. The joint spaces are preserved. No significant soft tissue abnormalities are seen. IMPRESSION: No evidence of fracture or dislocation. Electronically Signed   By: Roanna Raider M.D.   On: 12/01/2017 01:07    Procedures Procedures (including critical care time)  Medications Ordered in ED Medications  HYDROcodone-acetaminophen (NORCO/VICODIN) 5-325 MG per tablet 1 tablet (1 tablet Oral Given 12/01/17 0328)     Initial Impression / Assessment and Plan / ED Course  I have reviewed the triage vital signs and the nursing notes.  Pertinent labs & imaging results that were available during my care of the patient were reviewed by me and considered in my medical decision making (see chart for details).       Final Clinical Impressions(s) / ED Diagnoses   Final diagnoses:  Sprain of right ankle, unspecified ligament, initial encounter    ED Discharge Orders        Ordered    traMADol (ULTRAM) 50 MG tablet  Every 6 hours PRN     12/01/17 0328    ibuprofen (ADVIL,MOTRIN) 800 MG tablet  3 times daily     12/01/17 0328       Gilda Crease, MD 12/01/17 [phone]

## 2017-12-01 NOTE — Progress Notes (Signed)
Orthopedic Tech Progress Note Patient Details:  Miguel Gallegos 12-29-1985 [id number]  Ortho Devices Type of Ortho Device: CAM walker Ortho Device/Splint Location: rle Ortho Device/Splint Interventions: Ordered, Application, Adjustment   Post Interventions Patient Tolerated: Well Instructions Provided: Care of device, Adjustment of device   Trinna Post 12/01/2017, 5:02 AM

## 2018-05-21 ENCOUNTER — Encounter (HOSPITAL_COMMUNITY): Payer: Self-pay | Admitting: *Deleted

## 2018-05-21 ENCOUNTER — Emergency Department (HOSPITAL_COMMUNITY)
Admission: EM | Admit: 2018-05-21 | Discharge: 2018-05-21 | Disposition: A | Discharge status: Home / Self Care | Payer: Self-pay | Attending: Emergency Medicine | Admitting: Emergency Medicine

## 2018-05-21 DIAGNOSIS — M546 Pain in thoracic spine: Secondary | ICD-10-CM | POA: Insufficient documentation

## 2018-05-21 DIAGNOSIS — J45909 Unspecified asthma, uncomplicated: Secondary | ICD-10-CM | POA: Insufficient documentation

## 2018-05-21 DIAGNOSIS — F17210 Nicotine dependence, cigarettes, uncomplicated: Secondary | ICD-10-CM | POA: Insufficient documentation

## 2018-05-21 DIAGNOSIS — Z79899 Other long term (current) drug therapy: Secondary | ICD-10-CM | POA: Insufficient documentation

## 2018-05-21 MED ORDER — TIZANIDINE HCL 2 MG PO CAPS: 2.0000 mg | ORAL_CAPSULE | Freq: Three times a day (TID) | ORAL | 0 refills | Status: DC

## 2018-05-21 MED ORDER — PREDNISONE 20 MG PO TABS
60.0000 mg | ORAL_TABLET | ORAL | Status: AC
  Administered 2018-05-21: 60 mg via ORAL
  Filled 2018-05-21: qty 3

## 2018-05-21 MED ORDER — PREDNISONE 20 MG PO TABS: 40.0000 mg | ORAL_TABLET | Freq: Every day | ORAL | 0 refills | Status: DC

## 2018-05-21 MED ORDER — TIZANIDINE HCL 4 MG PO TABS
2.0000 mg | ORAL_TABLET | Freq: Once | ORAL | Status: AC
  Administered 2018-05-21: 2 mg via ORAL
  Filled 2018-05-21: qty 1

## 2018-05-21 NOTE — ED Provider Notes (Signed)
[facility] EMERGENCY DEPARTMENT Provider Note   CSN: [id number] Arrival date & time: 05/21/18  1228     History   Chief Complaint Chief Complaint  Patient presents with  . Back Pain    HPI Miguel Gallegos is a 32 y.o. male.  HPI  Presents with concern of acute onset left-sided infrascapular pain. Patient was at work, lifting a heavy object, twisting, when he felt the onset of pain. Since that time pain is been severe, sore, focally in the left paraspinal, left infrascapular region. Pain is nonradiating, no medication taken for relief. No associated new complaints including chest pain, dyspnea, syncope, incontinence or other notable findings.    Past Medical History:  Diagnosis Date  . Anxiety   . Asthma   . Seizures (HCC)     There are no active problems to display for this patient.   History reviewed. No pertinent surgical history.      Home Medications    Prior to Admission medications   Medication Sig Start Date End Date Taking? Authorizing Provider  albuterol (PROVENTIL HFA;VENTOLIN HFA) 108 (90 Base) MCG/ACT inhaler Inhale 2 puffs into the lungs every 4 (four) hours as needed for wheezing or shortness of breath. 12/19/16   Ward, Layla Maw, DO  ibuprofen (ADVIL,MOTRIN) 800 MG tablet Take 1 tablet (800 mg total) by mouth 3 (three) times daily. 12/01/17   Gilda Crease, MD  predniSONE (DELTASONE) 20 MG tablet Take 2 tablets (40 mg total) by mouth daily with breakfast. For the next four days 05/21/18   Gerhard Munch, MD  tizanidine (ZANAFLEX) 2 MG capsule Take 1 capsule (2 mg total) by mouth 3 (three) times daily. 05/21/18   Gerhard Munch, MD  traMADol (ULTRAM) 50 MG tablet Take 1 tablet (50 mg total) by mouth every 6 (six) hours as needed. 12/01/17   Gilda Crease, MD    Family History History reviewed. No pertinent family history.  Social History Social History   Tobacco Use  . Smoking status: Current Every Day Smoker      Packs/day: 0.00    Types: Cigarettes  . Smokeless tobacco: Never Used  Substance Use Topics  . Alcohol use: Yes    Comment: occa  . Drug use: No     Allergies   Patient has no known allergies.   Review of Systems Review of Systems  Constitutional:       Per HPI, otherwise negative  HENT:       Per HPI, otherwise negative  Respiratory:       Per HPI, otherwise negative  Cardiovascular:       Per HPI, otherwise negative  Gastrointestinal: Negative for vomiting.  Endocrine:       Negative aside from HPI  Genitourinary:       Neg aside from HPI   Musculoskeletal:       Per HPI, otherwise negative  Skin: Negative.   Neurological: Negative for syncope.     Physical Exam Updated Vital Signs BP (!) 123/95 (BP Location: Right Arm)   Pulse (!) 104   Temp 98.4 F (36.9 C) (Oral)   Resp 16   SpO2 100%   Physical Exam  Constitutional: He is oriented to person, place, and time. He appears well-developed. No distress.  HENT:  Head: Normocephalic and atraumatic.  Eyes: Conjunctivae and EOM are normal.  Cardiovascular: Normal rate and regular rhythm.  Pulmonary/Chest: Effort normal. No stridor. No respiratory distress.  Abdominal: He exhibits no distension.  Musculoskeletal:  He exhibits no edema.       Arms: Neurological: He is alert and oriented to person, place, and time.  Skin: Skin is warm and dry.  Psychiatric: He has a normal mood and affect.  Nursing note and vitals reviewed.    ED Treatments / Results  Labs (all labs ordered are listed, but only abnormal results are displayed) Labs Reviewed - No data to display  EKG None  Radiology No results found.  Procedures Procedures (including critical care time)  Medications Ordered in ED Medications  tiZANidine (ZANAFLEX) tablet 2 mg (has no administration in time range)  predniSONE (DELTASONE) tablet 60 mg (has no administration in time range)     Initial Impression / Assessment and Plan / ED  Course  I have reviewed the triage vital signs and the nursing notes.  Pertinent labs & imaging results that were available during my care of the patient were reviewed by me and considered in my medical decision making (see chart for details).   Appearing male presents after acute onset left-sided thoracic back pain purulent patient has no neurologic complications, no complaints, is otherwise generally well, is hemodynamically stable, may have acute strain, and with reassuring findings, physical exam, start on a course of anti-inflammatories, muscle relaxants, will follow up with primary care.  Final Clinical Impressions(s) / ED Diagnoses   Final diagnoses:  Acute right-sided thoracic back pain    ED Discharge Orders         Ordered    predniSONE (DELTASONE) 20 MG tablet  Daily with breakfast     05/21/18 1252    tizanidine (ZANAFLEX) 2 MG capsule  3 times daily     05/21/18 1252           Gerhard Munch, MD 05/21/18 1255

## 2018-05-21 NOTE — ED Triage Notes (Signed)
Pt in c/o mid back pain that started while lifting something at work, pain was sudden onset when he stood up from a leaning position, no history of same, worse with movement

## 2018-05-21 NOTE — Discharge Instructions (Signed)
As discussed, your evaluation today has been largely reassuring.  But, it is important that you monitor your condition carefully, and do not hesitate to return to the ED if you develop new, or concerning changes in your condition.  Otherwise, please follow-up with occupational health office for clearance to return to work. Please use the prescribed medication as directed.

## 2018-12-11 ENCOUNTER — Emergency Department (HOSPITAL_BASED_OUTPATIENT_CLINIC_OR_DEPARTMENT_OTHER)
Admission: EM | Admit: 2018-12-11 | Discharge: 2018-12-11 | Disposition: A | Discharge status: Home / Self Care | Payer: Self-pay | Attending: Emergency Medicine | Admitting: Emergency Medicine

## 2018-12-11 ENCOUNTER — Emergency Department (HOSPITAL_BASED_OUTPATIENT_CLINIC_OR_DEPARTMENT_OTHER): Payer: Self-pay

## 2018-12-11 ENCOUNTER — Encounter (HOSPITAL_BASED_OUTPATIENT_CLINIC_OR_DEPARTMENT_OTHER): Payer: Self-pay | Admitting: *Deleted

## 2018-12-11 ENCOUNTER — Other Ambulatory Visit: Payer: Self-pay

## 2018-12-11 DIAGNOSIS — M7541 Impingement syndrome of right shoulder: Secondary | ICD-10-CM

## 2018-12-11 DIAGNOSIS — Z79899 Other long term (current) drug therapy: Secondary | ICD-10-CM | POA: Insufficient documentation

## 2018-12-11 DIAGNOSIS — M792 Neuralgia and neuritis, unspecified: Secondary | ICD-10-CM

## 2018-12-11 DIAGNOSIS — F17210 Nicotine dependence, cigarettes, uncomplicated: Secondary | ICD-10-CM | POA: Insufficient documentation

## 2018-12-11 DIAGNOSIS — J45909 Unspecified asthma, uncomplicated: Secondary | ICD-10-CM | POA: Insufficient documentation

## 2018-12-11 MED ORDER — METHOCARBAMOL 500 MG PO TABS: 1000.0000 mg | ORAL_TABLET | Freq: Three times a day (TID) | ORAL | 0 refills | Status: DC | PRN

## 2018-12-11 MED ORDER — KETOROLAC TROMETHAMINE 60 MG/2ML IM SOLN
60.0000 mg | Freq: Once | INTRAMUSCULAR | Status: AC
Start: 2018-12-11 — End: 2018-12-11
  Administered 2018-12-11: 60 mg via INTRAMUSCULAR
  Filled 2018-12-11: qty 2

## 2018-12-11 MED ORDER — DEXAMETHASONE SODIUM PHOSPHATE 10 MG/ML IJ SOLN
10.0000 mg | Freq: Once | INTRAMUSCULAR | Status: AC
  Administered 2018-12-11: 14:00:00 10 mg via INTRAMUSCULAR
  Filled 2018-12-11: qty 1

## 2018-12-11 MED ORDER — METHYLPREDNISOLONE 4 MG PO TBPK: ORAL_TABLET | ORAL | 0 refills | Status: DC

## 2018-12-11 NOTE — Discharge Instructions (Signed)
1.  You appear to have a pinched nerve in your shoulder.  Take prednisone starting tomorrow as prescribed.  Take Robaxin for muscle relaxer as prescribed. 2.  Call the orthopedic doctor today to schedule an appointment as soon as possible. 3.  Return to the emergency department if your symptoms are worsening or not improving in the next 24 to 48 hours.

## 2018-12-11 NOTE — ED Notes (Signed)
Patient transported to X-ray 

## 2018-12-11 NOTE — ED Notes (Signed)
Woke w sharp rt shoulder pain this  Pain radiates down arm w some hand swelling  Denies inj

## 2018-12-11 NOTE — ED Triage Notes (Signed)
He woke with sharp pain in his right shoulder. His hand has started to swell since he woke up and it has a tingling sensation with movement.

## 2018-12-11 NOTE — ED Provider Notes (Signed)
[facility] EMERGENCY DEPARTMENT Provider Note   CSN: [id number] Arrival date & time: 12/11/18  1329    History   Chief Complaint Chief Complaint  Patient presents with  . Shoulder Pain    HPI Miguel Gallegos is a 33 y.o. male.     HPI Patient reports he fairly frequently has tingling in his arm and a sore shoulder when he sleeps on the right arm.  Usually however it starts to go away after he gets up and starts getting active.  Reports today it has not improved.  He went to work.  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  He made it through his day but he reports that the pain is really severe in the shoulder and with any movement makes it worse.  He reports its tingling all the way down to his hand.  Is been no improvement over the course of the day.  Denies any neck pain.  No back pain.  No chest pain or shortness of breath.  No other associated symptoms.  No prior neck or back surgeries. Past Medical History:  Diagnosis Date  . Anxiety   . Asthma   . Seizures (HCC)     There are no active problems to display for this patient.   History reviewed. No pertinent surgical history.      Home Medications    Prior to Admission medications   Medication Sig Start Date End Date Taking? Authorizing Provider  albuterol (PROVENTIL HFA;VENTOLIN HFA) 108 (90 Base) MCG/ACT inhaler Inhale 2 puffs into the lungs every 4 (four) hours as needed for wheezing or shortness of breath. 12/19/16  Yes Ward, Layla MawKristen N, DO  ibuprofen (ADVIL,MOTRIN) 800 MG tablet Take 1 tablet (800 mg total) by mouth 3 (three) times daily. 12/01/17   Gilda CreasePollina, Christopher J, MD  predniSONE (DELTASONE) 20 MG tablet Take 2 tablets (40 mg total) by mouth daily with breakfast. For the next four days 05/21/18   Gerhard MunchLockwood, Robert, MD  tizanidine (ZANAFLEX) 2 MG capsule Take 1 capsule (2 mg total) by mouth 3 (three) times daily. 05/21/18   Gerhard MunchLockwood, Robert, MD  traMADol (ULTRAM) 50 MG tablet Take 1  tablet (50 mg total) by mouth every 6 (six) hours as needed. 12/01/17   Gilda CreasePollina, Christopher J, MD    Family History No family history on file.  Social History Social History   Tobacco Use  . Smoking status: Current Every Day Smoker    Packs/day: 0.00    Types: Cigarettes  . Smokeless tobacco: Never Used  Substance Use Topics  . Alcohol use: Yes    Comment: occa  . Drug use: No     Allergies   Patient has no known allergies.   Review of Systems Review of Systems Constitutional: No fever chills or malaise Respiratory: No cough no shortness of breath no chest pain.  Physical Exam Updated Vital Signs BP 114/86   Pulse 90   Temp 98.2 F (36.8 C) (Oral)   Resp 18   Ht 5\' 7"  (1.702 m)   Wt 89.7 kg   SpO2 99%   BMI 30.97 kg/m   Physical Exam Constitutional:      Comments: Alert and well in appearance.  No respiratory distress.  HENT:     Head: Normocephalic and atraumatic.  Eyes:     Extraocular Movements: Extraocular movements intact.  Neck:     Comments: No C-spine tenderness to palpation.  Soft tissues of neck are supple. Cardiovascular:  Rate and Rhythm: Normal rate and regular rhythm.     Pulses: Normal pulses.     Heart sounds: Normal heart sounds.  Pulmonary:     Effort: Pulmonary effort is normal.     Breath sounds: Normal breath sounds.  Musculoskeletal:     Comments: Pain to palpation starts at the top of the shoulder about 2 cm from the humerus.  This continues over the top and point of the shoulder to palpation.  Severe pain with general range of motion of the shoulder.  There is no locking however shoulder can be brought into abduction abduction and forward flexion.  Normal range of motion of the elbow without effusion.  Normal range of motion of the hand without effusion.  Radial pulses 2+ and strong.  The hand is warm and dry with brisk cap refill.  Skin:    General: Skin is warm and dry.  Neurological:     General: No focal deficit present.      Mental Status: He is oriented to person, place, and time.     Coordination: Coordination normal.  Psychiatric:        Mood and Affect: Mood normal.      ED Treatments / Results  Labs (all labs ordered are listed, but only abnormal results are displayed) Labs Reviewed - No data to display  EKG None  Radiology Dg Shoulder Right  Result Date: 12/11/2018 CLINICAL DATA:  Acute right shoulder pain without known injury. EXAM: RIGHT SHOULDER - 2+ VIEW COMPARISON:  None. FINDINGS: There is no evidence of fracture or dislocation. There is no evidence of arthropathy or other focal bone abnormality. Soft tissues are unremarkable. IMPRESSION: Negative. Electronically Signed   By: Lupita Raider M.D.   On: 12/11/2018 15:04    Procedures Procedures (including critical care time)  Medications Ordered in ED Medications  dexamethasone (DECADRON) injection 10 mg (10 mg Intramuscular Given 12/11/18 1429)  ketorolac (TORADOL) injection 60 mg (60 mg Intramuscular Given 12/11/18 1429)     Initial Impression / Assessment and Plan / ED Course  I have reviewed the triage vital signs and the nursing notes.  Pertinent labs & imaging results that were available during my care of the patient were reviewed by me and considered in my medical decision making (see chart for details).        Patient describes recurrent problems with impingement like syndrome of the shoulder, sleeping in an awkward position.  This usually resolves.  He does a lot of range of motion work which is probably exacerbating the situation.  They symptoms did not improve and he has now persistent severe pain in the shoulder without injury and also neurotic pathic type pain.  No neck symptoms.  No sign of any disc compression.  Motor strength intact.  Will start with steroid and muscle relaxer management and close follow-up with orthopedics.  Final Clinical Impressions(s) / ED Diagnoses   Final diagnoses:  Internal derangement of  right shoulder  Neuralgia    ED Discharge Orders    None       Arby Barrette, MD 12/11/18 1525

## 2019-02-19 ENCOUNTER — Emergency Department (HOSPITAL_BASED_OUTPATIENT_CLINIC_OR_DEPARTMENT_OTHER): Payer: Self-pay

## 2019-02-19 ENCOUNTER — Emergency Department (HOSPITAL_BASED_OUTPATIENT_CLINIC_OR_DEPARTMENT_OTHER)
Admission: EM | Admit: 2019-02-19 | Discharge: 2019-02-19 | Disposition: A | Discharge status: Home / Self Care | Payer: Self-pay | Attending: Emergency Medicine | Admitting: Emergency Medicine

## 2019-02-19 ENCOUNTER — Other Ambulatory Visit: Payer: Self-pay

## 2019-02-19 ENCOUNTER — Encounter (HOSPITAL_BASED_OUTPATIENT_CLINIC_OR_DEPARTMENT_OTHER): Payer: Self-pay | Admitting: *Deleted

## 2019-02-19 DIAGNOSIS — J45909 Unspecified asthma, uncomplicated: Secondary | ICD-10-CM | POA: Insufficient documentation

## 2019-02-19 DIAGNOSIS — Z79899 Other long term (current) drug therapy: Secondary | ICD-10-CM | POA: Insufficient documentation

## 2019-02-19 DIAGNOSIS — F17210 Nicotine dependence, cigarettes, uncomplicated: Secondary | ICD-10-CM | POA: Insufficient documentation

## 2019-02-19 DIAGNOSIS — R569 Unspecified convulsions: Secondary | ICD-10-CM | POA: Insufficient documentation

## 2019-02-19 DIAGNOSIS — M25562 Pain in left knee: Secondary | ICD-10-CM | POA: Insufficient documentation

## 2019-02-19 MED ORDER — HYDROCODONE-ACETAMINOPHEN 5-325 MG PO TABS
1.0000 | ORAL_TABLET | Freq: Once | ORAL | Status: AC
  Administered 2019-02-19: 1 via ORAL
  Filled 2019-02-19: qty 1

## 2019-02-19 NOTE — ED Triage Notes (Addendum)
His left leg got caught in a bedpost this am. He felt a pop when he pulled his leg out. Pain in his lower leg.

## 2019-02-19 NOTE — Discharge Instructions (Signed)
Your x-rays were negative of your left leg.  We placed a knee brace.  You should not bear weight until you are advised by your doctor.  I have placed a referral to orthopedics, please give their office a call tomorrow to schedule an appointment.  You can use Tylenol or ibuprofen as needed for pain.  You should rest, ice her knee at least twice a day, keep the brace on when you are moving around, and elevate your leg is much as possible.  Return to care if you have worsening redness, swelling, pain in the area.

## 2019-02-19 NOTE — ED Provider Notes (Signed)
[facility] EMERGENCY DEPARTMENT Provider Note   CSN: [id number] Arrival date & time: 02/19/19  1851    History   Chief Complaint Chief Complaint  Patient presents with  . Leg Injury    HPI Miguel Gallegos is a 33 y.o. male.  Patient notes that this morning when he was getting out of bed, he caught his left leg in the bedpost, it twisted laterally and he felt a pop.  He states that since that time, he has been having pain in his posterior left leg, that radiates down to his foot.  He states that he went to work today and was walking around, but that walking made the pain worse.  Notes that he is having pain at extremes of range of motion.  Notes that he is able to feel his entire left lower extremity.  Denies noting swelling or erythema.  States that prior to today has been in his usual state of health.  Has not taken anything for the pain.   Past Medical History:  Diagnosis Date  . Anxiety   . Asthma   . Seizures (HCC)     There are no active problems to display for this patient.   History reviewed. No pertinent surgical history.      Home Medications    Prior to Admission medications   Medication Sig Start Date End Date Taking? Authorizing Provider  albuterol (PROVENTIL HFA;VENTOLIN HFA) 108 (90 Base) MCG/ACT inhaler Inhale 2 puffs into the lungs every 4 (four) hours as needed for wheezing or shortness of breath. 12/19/16  Yes Ward, Layla MawKristen N, DO  ibuprofen (ADVIL,MOTRIN) 800 MG tablet Take 1 tablet (800 mg total) by mouth 3 (three) times daily. 12/01/17  Yes Pollina, Canary Brimhristopher J, MD  methocarbamol (ROBAXIN) 500 MG tablet Take 2 tablets (1,000 mg total) by mouth every 8 (eight) hours as needed for muscle spasms. 12/11/18   Arby BarrettePfeiffer, Marcy, MD  methylPREDNISolone (MEDROL DOSEPAK) 4 MG TBPK tablet Start on 5/29 12/11/18   Arby BarrettePfeiffer, Marcy, MD  predniSONE (DELTASONE) 20 MG tablet Take 2 tablets (40 mg total) by mouth daily with breakfast. For the next four days 05/21/18    Gerhard MunchLockwood, Robert, MD  tizanidine (ZANAFLEX) 2 MG capsule Take 1 capsule (2 mg total) by mouth 3 (three) times daily. 05/21/18   Gerhard MunchLockwood, Robert, MD  traMADol (ULTRAM) 50 MG tablet Take 1 tablet (50 mg total) by mouth every 6 (six) hours as needed. 12/01/17   Gilda CreasePollina, Christopher J, MD    Family History No family history on file.  Social History Social History   Tobacco Use  . Smoking status: Current Every Day Smoker    Packs/day: 0.00    Types: Cigarettes  . Smokeless tobacco: Never Used  Substance Use Topics  . Alcohol use: Yes    Comment: occa  . Drug use: No     Allergies   Patient has no known allergies.   Review of Systems Review of Systems  Constitutional: Negative for fever.  HENT: Negative for congestion and sore throat.   Respiratory: Negative for cough and shortness of breath.   Cardiovascular: Negative for chest pain.  Gastrointestinal: Negative for abdominal pain.  Genitourinary: Negative for difficulty urinating.  Musculoskeletal:       Left knee pain     Physical Exam Updated Vital Signs BP 119/86   Pulse 84   Temp 98.4 F (36.9 C) (Oral)   Resp 20   Ht 5\' 7"  (1.702 m)   Wt 74.8 kg  SpO2 100%   BMI 25.84 kg/m   Physical Exam Constitutional:      General: He is not in acute distress. HENT:     Head: Normocephalic and atraumatic.  Cardiovascular:     Rate and Rhythm: Normal rate and regular rhythm.     Heart sounds: No murmur.  Pulmonary:     Effort: Pulmonary effort is normal. No respiratory distress.     Breath sounds: Normal breath sounds.  Musculoskeletal:        General: Tenderness (Diffuse tenderness to palpation of left knee, left posterior calf, tibial spine) present. No swelling.     Comments: No obvious visual deformity of the left lower extremity, no erythema, range of motion limited at extreme of flexion of left knee, pain with dorsiflexion and plantar flexion of left foot, exam extremely limited due to patient's pain and  cooperation.  2+ dorsalis pedis pulse on left  Neurological:     Mental Status: He is alert.     Sensory: No sensory deficit.      ED Treatments / Results  Labs (all labs ordered are listed, but only abnormal results are displayed) Labs Reviewed - No data to display  EKG None  Radiology Dg Tibia/fibula Left  Result Date: 02/19/2019 CLINICAL DATA:  Pain, caught in bedpost EXAM: LEFT TIBIA AND FIBULA - 2 VIEW COMPARISON:  None. FINDINGS: There is no evidence of fracture or other focal bone lesions. Soft tissues are unremarkable. IMPRESSION: Negative. Electronically Signed   By: Jasmine PangKim  Fujinaga M.D.   On: 02/19/2019 20:14   Dg Knee Complete 4 Views Left  Result Date: 02/19/2019 CLINICAL DATA:  Left knee pain after injury EXAM: LEFT KNEE - COMPLETE 4+ VIEW COMPARISON:  None. FINDINGS: No evidence of fracture, dislocation, or joint effusion. No evidence of arthropathy or other focal bone abnormality. Soft tissues are unremarkable. IMPRESSION: Negative. Electronically Signed   By: Jasmine PangKim  Fujinaga M.D.   On: 02/19/2019 20:16    Procedures Procedures (including critical care time)  Medications Ordered in ED Medications  HYDROcodone-acetaminophen (NORCO/VICODIN) 5-325 MG per tablet 1 tablet (1 tablet Oral Given 02/19/19 2000)     Initial Impression / Assessment and Plan / ED Course  I have reviewed the triage vital signs and the nursing notes.  Pertinent labs & imaging results that were available during my care of the patient were reviewed by me and considered in my medical decision making (see chart for details).  Patient is a 33 year old male who presents with left knee and lower leg pain after feeling a pop this morning after twisting his left lower extremity.  Diffuse tenderness to palpation of knee and anterior tibial spine on exam.  Difficult to examine range of motion and special test given patient's pain and lack of cooperation.  X-rays of left knee and left tibia/fibula are negative  for acute fracture.  No evidence of effusion on left knee x-ray.  Given patient's history, and pain, suspect ligamentous injury.  Will give knee immobilizer and refer to orthopedics for follow-up.  Patient advised of this, agrees to plan, given appropriate return precautions and voiced understanding.  Final Clinical Impressions(s) / ED Diagnoses   Final diagnoses:  Acute pain of left knee    ED Discharge Orders         Ordered    Ambulatory referral to Orthopedic Surgery     02/19/19 2033           Unknown JimMeccariello, Bailey J, DO 02/19/19 2039  Isla Pence, MD 02/19/19 2112

## 2019-12-07 ENCOUNTER — Other Ambulatory Visit: Payer: Self-pay

## 2019-12-07 ENCOUNTER — Emergency Department (HOSPITAL_COMMUNITY)
Admission: EM | Admit: 2019-12-07 | Discharge: 2019-12-08 | Disposition: A | Discharge status: Home / Self Care | Payer: Self-pay | Attending: Emergency Medicine | Admitting: Emergency Medicine

## 2019-12-07 ENCOUNTER — Emergency Department (HOSPITAL_COMMUNITY): Payer: Self-pay

## 2019-12-07 ENCOUNTER — Encounter (HOSPITAL_COMMUNITY): Payer: Self-pay | Admitting: *Deleted

## 2019-12-07 DIAGNOSIS — Y9389 Activity, other specified: Secondary | ICD-10-CM | POA: Insufficient documentation

## 2019-12-07 DIAGNOSIS — M545 Low back pain: Secondary | ICD-10-CM | POA: Insufficient documentation

## 2019-12-07 DIAGNOSIS — F17210 Nicotine dependence, cigarettes, uncomplicated: Secondary | ICD-10-CM | POA: Insufficient documentation

## 2019-12-07 DIAGNOSIS — Y999 Unspecified external cause status: Secondary | ICD-10-CM | POA: Insufficient documentation

## 2019-12-07 DIAGNOSIS — Y929 Unspecified place or not applicable: Secondary | ICD-10-CM | POA: Insufficient documentation

## 2019-12-07 DIAGNOSIS — R0789 Other chest pain: Secondary | ICD-10-CM | POA: Insufficient documentation

## 2019-12-07 DIAGNOSIS — S40211A Abrasion of right shoulder, initial encounter: Secondary | ICD-10-CM | POA: Insufficient documentation

## 2019-12-07 DIAGNOSIS — S70211A Abrasion, right hip, initial encounter: Secondary | ICD-10-CM | POA: Insufficient documentation

## 2019-12-07 DIAGNOSIS — T1490XA Injury, unspecified, initial encounter: Secondary | ICD-10-CM

## 2019-12-07 LAB — COMPREHENSIVE METABOLIC PANEL
ALT: 35 U/L (ref 0–44)
AST: 25 U/L (ref 15–41)
Albumin: 4.1 g/dL (ref 3.5–5.0)
Alkaline Phosphatase: 62 U/L (ref 38–126)
Anion gap: 11 (ref 5–15)
BUN: 11 mg/dL (ref 6–20)
CO2: 22 mmol/L (ref 22–32)
Calcium: 8.9 mg/dL (ref 8.9–10.3)
Chloride: 106 mmol/L (ref 98–111)
Creatinine, Ser: 1.1 mg/dL (ref 0.61–1.24)
GFR calc Af Amer: >60 mL/min (ref >60)
GFR calc non Af Amer: >60 mL/min (ref >60)
Glucose, Bld: 125 mg/dL — ABNORMAL HIGH (ref 70–99)
Potassium: 3.5 mmol/L (ref 3.5–5.1)
Sodium: 139 mmol/L (ref 135–145)
Total Bilirubin: 0.3 mg/dL (ref 0.3–1.2)
Total Protein: 6.7 g/dL (ref 6.5–8.1)

## 2019-12-07 LAB — ETHANOL: Alcohol, Ethyl (B): <10 mg/dL (ref <10)

## 2019-12-07 LAB — CBC
HCT: 46.2 % (ref 39.0–52.0)
Hemoglobin: 15.3 g/dL (ref 13.0–17.0)
MCH: 28.1 pg (ref 26.0–34.0)
MCHC: 33.1 g/dL (ref 30.0–36.0)
MCV: 84.9 fL (ref 80.0–100.0)
Platelets: 217 10*3/uL (ref 150–400)
RBC: 5.44 MIL/uL (ref 4.22–5.81)
RDW: 12.8 % (ref 11.5–15.5)
WBC: 8.9 10*3/uL (ref 4.0–10.5)
nRBC: 0 % (ref 0.0–0.2)

## 2019-12-07 LAB — I-STAT CHEM 8, ED
BUN: 15 mg/dL (ref 6–20)
Calcium, Ion: 1.15 mmol/L (ref 1.15–1.40)
Chloride: 107 mmol/L (ref 98–111)
Creatinine, Ser: 1 mg/dL (ref 0.61–1.24)
Glucose, Bld: 115 mg/dL — ABNORMAL HIGH (ref 70–99)
HCT: 44 % (ref 39.0–52.0)
Hemoglobin: 15 g/dL (ref 13.0–17.0)
Potassium: 3.6 mmol/L (ref 3.5–5.1)
Sodium: 139 mmol/L (ref 135–145)
TCO2: 25 mmol/L (ref 22–32)

## 2019-12-07 LAB — SAMPLE TO BLOOD BANK

## 2019-12-07 LAB — PROTIME-INR
INR: 1 (ref 0.8–1.2)
Prothrombin Time: 12.6 seconds (ref 11.4–15.2)

## 2019-12-07 LAB — LACTIC ACID, PLASMA: Lactic Acid, Venous: 1.2 mmol/L (ref 0.5–1.9)

## 2019-12-07 LAB — CDS SEROLOGY

## 2019-12-07 MED ORDER — IOHEXOL 300 MG/ML  SOLN
100.0000 mL | Freq: Once | INTRAMUSCULAR | Status: AC | PRN
  Administered 2019-12-07: 100 mL via INTRAVENOUS

## 2019-12-07 NOTE — Progress Notes (Signed)
Orthopedic Tech Progress Note Patient Details:  Miguel Gallegos Jun 26, 1986 [id number] Level 2 Trauma  Patient ID: Miguel Gallegos, male   DOB: [birth year], 34 y.o.   MRN: [id number]   Miguel Gallegos 12/07/2019, 11:00 PM
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
## 2019-12-07 NOTE — ED Notes (Signed)
Spouse Raynelle Fanning called requesting a status update.  [phone]

## 2019-12-08 ENCOUNTER — Encounter (HOSPITAL_BASED_OUTPATIENT_CLINIC_OR_DEPARTMENT_OTHER): Payer: Self-pay | Admitting: *Deleted

## 2019-12-08 MED ORDER — FENTANYL CITRATE (PF) 100 MCG/2ML IJ SOLN
100.0000 ug | Freq: Once | INTRAMUSCULAR | Status: AC
  Administered 2019-12-08: 100 ug via INTRAVENOUS

## 2019-12-08 MED ORDER — FENTANYL CITRATE (PF) 100 MCG/2ML IJ SOLN
INTRAMUSCULAR | Status: AC
  Filled 2019-12-08: qty 2

## 2019-12-08 MED ORDER — KETOROLAC TROMETHAMINE 15 MG/ML IJ SOLN
15.0000 mg | Freq: Once | INTRAMUSCULAR | Status: AC
  Administered 2019-12-08: 15 mg via INTRAVENOUS
  Filled 2019-12-08: qty 1
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
## 2019-12-08 NOTE — Discharge Instructions (Signed)
Take 800mg  ibuprofen every 8 hours for the next 2-3 days. Resume activity as tolerated. Return to the ED for new or worsening symptoms.

## 2019-12-31 ENCOUNTER — Encounter (HOSPITAL_BASED_OUTPATIENT_CLINIC_OR_DEPARTMENT_OTHER): Payer: Self-pay | Admitting: Emergency Medicine

## 2019-12-31 ENCOUNTER — Emergency Department (HOSPITAL_BASED_OUTPATIENT_CLINIC_OR_DEPARTMENT_OTHER)
Admission: EM | Admit: 2019-12-31 | Discharge: 2019-12-31 | Disposition: A | Discharge status: Home / Self Care | Payer: Self-pay | Attending: Emergency Medicine | Admitting: Emergency Medicine

## 2019-12-31 ENCOUNTER — Emergency Department (HOSPITAL_BASED_OUTPATIENT_CLINIC_OR_DEPARTMENT_OTHER): Payer: Self-pay

## 2019-12-31 ENCOUNTER — Other Ambulatory Visit: Payer: Self-pay

## 2019-12-31 DIAGNOSIS — J45909 Unspecified asthma, uncomplicated: Secondary | ICD-10-CM | POA: Insufficient documentation

## 2019-12-31 DIAGNOSIS — F17210 Nicotine dependence, cigarettes, uncomplicated: Secondary | ICD-10-CM | POA: Insufficient documentation

## 2019-12-31 DIAGNOSIS — R059 Cough, unspecified: Secondary | ICD-10-CM

## 2019-12-31 DIAGNOSIS — R05 Cough: Secondary | ICD-10-CM | POA: Insufficient documentation

## 2019-12-31 NOTE — Discharge Instructions (Addendum)
It is recommended you quarantine at home if you continue to have symptoms, until your symptoms resolve.  You can use over the counter medications as needed.  If you have significant shortness of breath, return to the ER.

## 2019-12-31 NOTE — ED Triage Notes (Signed)
Was sent home from work due to coughing today. Also states "my body is shaking and it won't stop"

## 2019-12-31 NOTE — ED Provider Notes (Signed)
[facility] EMERGENCY DEPARTMENT Provider Note   CSN: [id number] Arrival date & time: 12/31/19  1302     History Chief Complaint  Patient presents with  . Cough    Tymarion Everard is a 34 y.o. male w PMHx asthma, presenting to the ED with complaint of cough that began last night.  He describes it as a tickle in his throat with a intermittent dry cough months throughout the day.  He has been needing to use his albuterol inhaler somewhat more frequently, however is not feeling short of breath.  He denies fevers, rhinorrhea, congestion, sore throat, changes in taste or smell, fevers, chills.  No known Covid exposures.  The history is provided by the patient.       Past Medical History:  Diagnosis Date  . Anxiety   . Asthma   . Seizures ([place])     There are no problems to display for this patient.   History reviewed. No pertinent surgical history.     No family history on file.  Social History   Tobacco Use  . Smoking status: Current Every Day Smoker    Packs/day: 0.00    Types: Cigarettes  . Smokeless tobacco: Never Used  Substance Use Topics  . Alcohol use: Not Currently    Comment: occa  . Drug use: Not Currently    Home Medications Prior to Admission medications   Medication Sig Start Date End Date Taking? Authorizing Provider  acetaminophen (TYLENOL) 500 MG tablet Take 500 mg by mouth daily as needed for mild pain.    [provider]  albuterol (PROVENTIL HFA;VENTOLIN HFA) 108 (90 Base) MCG/ACT inhaler Inhale 2 puffs into the lungs every 4 (four) hours as needed for wheezing or shortness of breath. 12/19/16   Ward, Delice Bison, DO  albuterol (VENTOLIN HFA) 108 (90 Base) MCG/ACT inhaler Inhale 2 puffs into the lungs every 6 (six) hours as needed for wheezing or shortness of breath.    [provider]  ibuprofen (ADVIL) 200 MG tablet Take 400 mg by mouth 2 (two) times daily as needed for moderate pain.    [provider]  ibuprofen  (ADVIL,MOTRIN) 800 MG tablet Take 1 tablet (800 mg total) by mouth 3 (three) times daily. 12/01/17   Orpah Greek, MD    Allergies    Patient has no known allergies.  Review of Systems   Review of Systems  Constitutional: Negative for fever.  Respiratory: Positive for cough.     Physical Exam Updated Vital Signs BP 131/80 (BP Location: Right Arm)   Pulse 90   Temp 99 F (37.2 C) (Oral)   Resp 20   Ht 5\' 7"  (1.702 m)   Wt 79.4 kg   SpO2 99%   BMI 27.41 kg/m   Physical Exam Vitals and nursing note reviewed.  Constitutional:      General: He is not in acute distress.    Appearance: He is well-developed.  HENT:     Head: Normocephalic and atraumatic.  Eyes:     Conjunctiva/sclera: Conjunctivae normal.  Cardiovascular:     Rate and Rhythm: Normal rate and regular rhythm.  Pulmonary:     Effort: Pulmonary effort is normal. No respiratory distress.     Breath sounds: Normal breath sounds.  Neurological:     Mental Status: He is alert.  Psychiatric:        Mood and Affect: Mood normal.        Behavior: Behavior normal.  ED Results / Procedures / Treatments   Labs (all labs ordered are listed, but only abnormal results are displayed) Labs Reviewed - No data to display  EKG None  Radiology DG Chest 2 View  Result Date: 12/31/2019 CLINICAL DATA:  Cough.  Shaking. EXAM: CHEST - 2 VIEW COMPARISON:  12/07/2019 FINDINGS: Heart size is normal. Mediastinal shadows are normal. The lungs are clear. No bronchial thickening. No infiltrate, mass, effusion or collapse. Pulmonary vascularity is normal. No bony abnormality. IMPRESSION: Normal chest Electronically Signed   By: Paulina Fusi M.D.   On: 12/31/2019 14:47    Procedures Procedures (including critical care time)  Medications Ordered in ED Medications - No data to display  ED Course  I have reviewed the triage vital signs and the nursing notes.  Pertinent labs & imaging results that were available  during my care of the patient were reviewed by me and considered in my medical decision making (see chart for details).    MDM Rules/Calculators/A&P                          Patient with chronic dry cough since last night.  No fevers, shortness of breath, or other associated symptoms.  Chest x-ray is clear.  Recommend Covid test, however patient declined.  Recommend quarantine at home per Covid guidelines.  OTC medications as needed for symptoms.  Return precautions discussed.  Final Clinical Impression(s) / ED Diagnoses Final diagnoses:  Cough    Rx / DC Orders ED Discharge Orders    None       Santez Woodcox, [place] N, PA-C 12/31/19 1506    Alvira Monday, MD 01/01/20 2200

## 2020-08-09 ENCOUNTER — Other Ambulatory Visit: Payer: Self-pay

## 2020-08-09 ENCOUNTER — Emergency Department (HOSPITAL_BASED_OUTPATIENT_CLINIC_OR_DEPARTMENT_OTHER)
Admission: EM | Admit: 2020-08-09 | Discharge: 2020-08-09 | Disposition: A | Discharge status: Home / Self Care | Payer: Self-pay | Attending: Emergency Medicine | Admitting: Emergency Medicine

## 2020-08-09 ENCOUNTER — Encounter (HOSPITAL_BASED_OUTPATIENT_CLINIC_OR_DEPARTMENT_OTHER): Payer: Self-pay | Admitting: *Deleted

## 2020-08-09 DIAGNOSIS — F17210 Nicotine dependence, cigarettes, uncomplicated: Secondary | ICD-10-CM | POA: Insufficient documentation

## 2020-08-09 DIAGNOSIS — J45909 Unspecified asthma, uncomplicated: Secondary | ICD-10-CM | POA: Insufficient documentation

## 2020-08-09 DIAGNOSIS — H00015 Hordeolum externum left lower eyelid: Secondary | ICD-10-CM | POA: Insufficient documentation

## 2020-08-09 MED ORDER — ERYTHROMYCIN 5 MG/GM OP OINT
TOPICAL_OINTMENT | Freq: Once | OPHTHALMIC | Status: AC
  Administered 2020-08-09: 1 via OPHTHALMIC
  Filled 2020-08-09: qty 3.5

## 2020-08-09 MED ORDER — TETRACAINE HCL 0.5 % OP SOLN
2.0000 [drp] | Freq: Once | OPHTHALMIC | Status: DC
  Filled 2020-08-09: qty 4

## 2020-08-09 MED ORDER — FLUORESCEIN SODIUM 1 MG OP STRP
1.0000 | ORAL_STRIP | Freq: Once | OPHTHALMIC | Status: DC
  Filled 2020-08-09: qty 1

## 2020-08-09 NOTE — Discharge Instructions (Signed)
You have a stye, use warm compresses several times a day, you can also apply erythromycin eye ointment about a half-inch ribbon to the lower eyelid 4 times daily until symptoms resolve.  If this is not improving you can see your primary care doctor you can call to follow-up with Dr. Alben Spittle with ophthalmology.  Make sure you are washing your hands before touching your eye.  If you develop pain or swelling surrounding the eye, fevers, eye pain, drainage or changes in vision return for reevaluation.

## 2020-08-09 NOTE — ED Notes (Signed)
Patient denies pain and is resting comfortably.  

## 2020-08-09 NOTE — ED Triage Notes (Signed)
He has redness, pain and swelling to his left lower eye lid since yesterday.

## 2020-08-09 NOTE — ED Provider Notes (Signed)
[facility] EMERGENCY DEPARTMENT Provider Note   CSN: [id number] Arrival date & time: 08/09/20  3267     History Chief Complaint  Patient presents with  . Eye Problem    Miguel Gallegos is a 35 y.o. male.  Miguel Gallegos is a 34 y.o. male with a history of anxiety, asthma and seizures, who presents to the ED for evaluation of redness, pain and swelling to his left lower eyelid. Symptoms started yesterday, but have worsened today while he was at work so came in for evaluation. Denies any prior history of similar. He denies any pain in the eye, no discharge or drainage from the eye and has not noted any redness. No change in his vision, no pain with eye movement. He has not done anything to treat the symptoms prior to arrival. His coworkers told him he likely had a stye. He does not wear contacts or glasses. Denies getting anything in the eye or foreign body sensation. No other aggravating or alleviating factors.        Past Medical History:  Diagnosis Date  . Anxiety   . Asthma   . Seizures ([facility])     There are no problems to display for this patient.   History reviewed. No pertinent surgical history.     No family history on file.  Social History   Tobacco Use  . Smoking status: Current Every Day Smoker    Packs/day: 0.00    Types: Cigarettes  . Smokeless tobacco: Never Used  Substance Use Topics  . Alcohol use: Not Currently    Comment: occa  . Drug use: Not Currently    Home Medications Prior to Admission medications   Medication Sig Start Date End Date Taking? Authorizing Provider  acetaminophen (TYLENOL) 500 MG tablet Take 500 mg by mouth daily as needed for mild pain.    [provider]  albuterol (PROVENTIL HFA;VENTOLIN HFA) 108 (90 Base) MCG/ACT inhaler Inhale 2 puffs into the lungs every 4 (four) hours as needed for wheezing or shortness of breath. 12/19/16   Ward, Layla Maw, DO  albuterol (VENTOLIN HFA) 108 (90 Base) MCG/ACT inhaler Inhale  2 puffs into the lungs every 6 (six) hours as needed for wheezing or shortness of breath.    [provider]  ibuprofen (ADVIL) 200 MG tablet Take 400 mg by mouth 2 (two) times daily as needed for moderate pain.    [provider]  ibuprofen (ADVIL,MOTRIN) 800 MG tablet Take 1 tablet (800 mg total) by mouth 3 (three) times daily. 12/01/17   Gilda Crease, MD    Allergies    Patient has no known allergies.  Review of Systems   Review of Systems  Constitutional: Negative for chills and fever.  Eyes: Negative for photophobia, pain, discharge, redness, itching and visual disturbance.       Pain in left lower eyelid with redness and swelling  Neurological: Negative for headaches.    Physical Exam Updated Vital Signs BP 125/86   Pulse 90   Temp 98.1 F (36.7 C) (Oral)   Resp 16   Ht 5\' 7"  (1.702 m)   Wt 91.9 kg   SpO2 98%   BMI 31.72 kg/m   Physical Exam Vitals and nursing note reviewed.  Constitutional:      General: He is not in acute distress.    Appearance: Normal appearance. He is well-developed, normal weight and well-nourished. He is not ill-appearing or diaphoretic.  HENT:     Head: Normocephalic  and atraumatic.  Eyes:     General:        Right eye: No discharge.        Left eye: No discharge.     Comments: Left eye with tenderness, redness and swelling noted to the medial aspect of the left lower eyelid, no associated conjunctival erythema, no eye discharge noted. No induration of the eyelid, no periorbital swelling or proptosis, PERRLA, EOMI, no consensual pain, no entrapment. Right eye unremarkable with no redness, tenderness or swelling of her eyelids.  Pulmonary:     Effort: Pulmonary effort is normal. No respiratory distress.  Musculoskeletal:        General: No deformity.  Skin:    General: Skin is warm and dry.  Neurological:     Mental Status: He is alert and oriented to person, place, and time.     Coordination: Coordination  normal.  Psychiatric:        Mood and Affect: Mood and affect and mood normal.        Behavior: Behavior normal.     ED Results / Procedures / Treatments   Labs (all labs ordered are listed, but only abnormal results are displayed) Labs Reviewed - No data to display  EKG None  Radiology No results found.  Procedures Procedures   Medications Ordered in ED Medications  fluorescein ophthalmic strip 1 strip (has no administration in time range)  tetracaine (PONTOCAINE) 0.5 % ophthalmic solution 2 drop (has no administration in time range)    ED Course  I have reviewed the triage vital signs and the nursing notes.  Pertinent labs & imaging results that were available during my care of the patient were reviewed by me and considered in my medical decision making (see chart for details).    MDM Rules/Calculators/A&P                          Physical exam with left lower lid erythema, tenderness and mild swelling without injection of the cornea or conjunctiva, consistent with external hordeolum.  No purulent discharge, entrapment, consensual photophobia.  No induration of the eyelid to suggest periorbital cellulitis. Presentation non-concerning for iritis, bacterial conjunctivitis, corneal abrasions, or HSV.   Will give erythromycin ointment for the left eye and discussed use of warm compresses. Personal hygiene and frequent handwashing also discussed.  Patient advised to followup with ophthalmologist if symptoms persist or worsen in any way including vision change or purulent discharge.    It has been determined that no acute conditions requiring further emergency intervention are present at this time. The patient/guardian have been advised of the diagnosis and plan. We have discussed signs and symptoms that warrant return to the ED, such as changes or worsening in symptoms.   Vital signs are stable at discharge.   BP 125/86   Pulse 90   Temp 98.1 F (36.7 C) (Oral)   Resp 16    Ht 5\' 7"  (1.702 m)   Wt 91.9 kg   SpO2 98%   BMI 31.72 kg/m   Patient has voiced understanding and agreed to follow-up with the PCP or specialist.   Final Clinical Impression(s) / ED Diagnoses Final diagnoses:  Hordeolum externum of left lower eyelid    Rx / DC Orders ED Discharge Orders    None       08/09/20 2131    2132, MD 08/09/20 2259

## 2020-10-20 ENCOUNTER — Emergency Department (HOSPITAL_BASED_OUTPATIENT_CLINIC_OR_DEPARTMENT_OTHER)
Admission: EM | Admit: 2020-10-20 | Discharge: 2020-10-20 | Disposition: A | Discharge status: Home / Self Care | Payer: PRIVATE HEALTH INSURANCE | Attending: Emergency Medicine | Admitting: Emergency Medicine

## 2020-10-20 ENCOUNTER — Encounter (HOSPITAL_BASED_OUTPATIENT_CLINIC_OR_DEPARTMENT_OTHER): Payer: Self-pay

## 2020-10-20 ENCOUNTER — Other Ambulatory Visit: Payer: Self-pay

## 2020-10-20 DIAGNOSIS — K0889 Other specified disorders of teeth and supporting structures: Secondary | ICD-10-CM | POA: Diagnosis not present

## 2020-10-20 DIAGNOSIS — J45909 Unspecified asthma, uncomplicated: Secondary | ICD-10-CM | POA: Diagnosis not present

## 2020-10-20 DIAGNOSIS — F17210 Nicotine dependence, cigarettes, uncomplicated: Secondary | ICD-10-CM | POA: Insufficient documentation

## 2020-10-20 MED ORDER — NAPROXEN 250 MG PO TABS
375.0000 mg | ORAL_TABLET | Freq: Once | ORAL | Status: AC
Start: 2020-10-20 — End: 2020-10-20
  Administered 2020-10-20: 375 mg via ORAL
  Filled 2020-10-20: qty 2

## 2020-10-20 MED ORDER — AMOXICILLIN 500 MG PO CAPS
500.0000 mg | ORAL_CAPSULE | Freq: Once | ORAL | Status: AC
  Administered 2020-10-20: 500 mg via ORAL
  Filled 2020-10-20: qty 1

## 2020-10-20 MED ORDER — NAPROXEN 375 MG PO TABS: 375.0000 mg | ORAL_TABLET | Freq: Two times a day (BID) | ORAL | 0 refills | Status: AC

## 2020-10-20 MED ORDER — AMOXICILLIN 500 MG PO CAPS: 500.0000 mg | ORAL_CAPSULE | Freq: Two times a day (BID) | ORAL | 0 refills | Status: AC

## 2020-10-20 NOTE — ED Provider Notes (Signed)
[facility] EMERGENCY DEPARTMENT Provider Note   CSN: [id number] Arrival date & time: 10/20/20  2134     History Chief Complaint  Patient presents with  . Dental Pain    Miguel Gallegos is a 35 y.o. male past med history anxiety, asthma, seizures who presents for evaluation of right lower dental pain x4 days.  States that it has become progressively more painful.  He has done over-the-counter medications, topical medications with no improvement in symptoms.  He does not currently have a dentist.  He denies any fevers, difficulty breathing, vomiting.  The history is provided by the patient.       Past Medical History:  Diagnosis Date  . Anxiety   . Asthma   . Seizures ([facility])     There are no problems to display for this patient.   History reviewed. No pertinent surgical history.     No family history on file.  Social History   Tobacco Use  . Smoking status: Current Every Day Smoker    Packs/day: 0.00    Types: Cigarettes  . Smokeless tobacco: Never Used  Substance Use Topics  . Alcohol use: Yes    Comment: occ  . Drug use: Not Currently    Home Medications Prior to Admission medications   Medication Sig Start Date End Date Taking? Authorizing Provider  amoxicillin (AMOXIL) 500 MG capsule Take 1 capsule (500 mg total) by mouth 2 (two) times daily for 7 days. 10/20/20 10/27/20 Yes Maxwell Caul, PA-C  naproxen (NAPROSYN) 375 MG tablet Take 1 tablet (375 mg total) by mouth 2 (two) times daily for 5 days. 10/20/20 10/25/20 Yes Maxwell Caul, PA-C  acetaminophen (TYLENOL) 500 MG tablet Take 500 mg by mouth daily as needed for mild pain.    [provider]  albuterol (PROVENTIL HFA;VENTOLIN HFA) 108 (90 Base) MCG/ACT inhaler Inhale 2 puffs into the lungs every 4 (four) hours as needed for wheezing or shortness of breath. 12/19/16   Ward, Layla Maw, DO  albuterol (VENTOLIN HFA) 108 (90 Base) MCG/ACT inhaler Inhale 2 puffs into the lungs every 6 (six)  hours as needed for wheezing or shortness of breath.    [provider]  ibuprofen (ADVIL) 200 MG tablet Take 400 mg by mouth 2 (two) times daily as needed for moderate pain.    [provider]  ibuprofen (ADVIL,MOTRIN) 800 MG tablet Take 1 tablet (800 mg total) by mouth 3 (three) times daily. 12/01/17   Gilda Crease, MD    Allergies    Patient has no known allergies.  Review of Systems   Review of Systems  Constitutional: Negative for fever.  HENT: Positive for dental problem.   Respiratory: Negative for shortness of breath.   Gastrointestinal: Negative for vomiting.  All other systems reviewed and are negative.   Physical Exam Updated Vital Signs BP 140/81 (BP Location: Left Arm)   Pulse 82   Temp 97.6 F (36.4 C) (Oral)   Resp 16   Ht 5\' 7"  (1.702 m)   Wt 88.9 kg   SpO2 99%   BMI 30.70 kg/m   Physical Exam Vitals and nursing note reviewed.  Constitutional:      Appearance: He is well-developed.  HENT:     Head: Normocephalic and atraumatic.     Comments: Face is symmetric in appearance without any overlying warmth, erythema, edema.    Mouth/Throat:     Comments: Tooth #32 is partially cracked with some surrounding gingival irritation.  No surrounding gingival edema.  No identifiable dental abscess.  No trismus noted.  Uvula is midline, airways patent. Eyes:     General: No scleral icterus.       Right eye: No discharge.        Left eye: No discharge.     Conjunctiva/sclera: Conjunctivae normal.  Pulmonary:     Effort: Pulmonary effort is normal.  Skin:    General: Skin is warm and dry.  Neurological:     Mental Status: He is alert.  Psychiatric:        Speech: Speech normal.        Behavior: Behavior normal.     ED Results / Procedures / Treatments   Labs (all labs ordered are listed, but only abnormal results are displayed) Labs Reviewed - No data to display  EKG None  Radiology No results found.  Procedures Procedures    Medications Ordered in ED Medications  amoxicillin (AMOXIL) capsule 500 mg (has no administration in time range)  naproxen (NAPROSYN) tablet 375 mg (has no administration in time range)    ED Course  I have reviewed the triage vital signs and the nursing notes.  Pertinent labs & imaging results that were available during my care of the patient were reviewed by me and considered in my medical decision making (see chart for details).    MDM Rules/Calculators/A&P                          35 yo M presents with 4 days of dental pain. No evidence of abscess requiring immediate incision and drainage. Exam not concerning for Ludwig's angina or pharyngeal abscess. Will treat with amoxicillin. Patient with no known drug allergies. Patient instructed to follow-up with dentist referral provided. Stable for discharge at this time. Strict return precautions discussed. Patient expresses understanding and agreement to plan.  Portions of this note were generated with Scientist, clinical (histocompatibility and immunogenetics). Dictation errors may occur despite best attempts at proofreading.   Final Clinical Impression(s) / ED Diagnoses Final diagnoses:  Pain, dental    Rx / DC Orders ED Discharge Orders         Ordered    amoxicillin (AMOXIL) 500 MG capsule  2 times daily        10/20/20 2155    naproxen (NAPROSYN) 375 MG tablet  2 times daily        10/20/20 2155           Rosana Hoes 10/20/20 2201    Koleen Distance, MD 10/20/20 2208

## 2020-10-20 NOTE — Discharge Instructions (Signed)
Take antibiotics as directed. Please take all of your antibiotics until finished.  You can take Tylenol or Ibuprofen as directed for pain. You can alternate Tylenol and Ibuprofen every 4 hours. If you take Tylenol at 1pm, then you can take Ibuprofen at 5pm. Then you can take Tylenol again at 9pm.   The exam and treatment you received today has been provided on an emergency basis only. This is not a substitute for complete medical or dental care. If your problem worsens or new symptoms (problems) appear, and you are unable to arrange prompt follow-up care with your dentist, call or return to this location. If you do not have a dentist, please follow-up with one on the list provided  CALL YOUR DENTIST OR RETURN IMMEDIATELY IF you develop a fever, rash, difficulty breathing or swallowing, neck or facial swelling, or other potentially serious concerns.   Please follow-up with one of the dental clinics provided to you below or in your paperwork. Call and tell them you were seen in the Emergency Dept and arrange for an appointment. You may have to call multiple places in order to find a place to be seen.  Dental Assistance If the dentist on-call cannot see you, please use the resources below:   Patients with Medicaid: [facility] [address], [phone], [phone]  If unable to pay, or uninsured, contact [facility] ([phone]) or [facility] ([phone] in [place], [phone] in [place]) to become qualified for the adult dental clinic  Other Low-Cost Community Dental Services: [facility]- [address], [place], [place], [address]    [phone], Ext. [phone]    2nd and 4th Thursday of the month at 6:30am    10 clients each day by appointment, can sometimes see walk-in     patients if someone does not show for an appointment [facility]- [address], [place], [place], [address]    [phone]  [facility]- [address], [place], [place], [address]    [phone]  [facility]- [phone] [facility]- [phone] [facility]- [phone]  

## 2020-10-20 NOTE — ED Notes (Signed)
AVS reviewed with client, discussed importance of taking all of the Abx prescribed by the ED provider. Also to make a follow up appt with the DDS as named on the AVS sheet. Opportunity for questions provided. Copy of AVS given to pt as well.

## 2020-10-20 NOTE — ED Triage Notes (Signed)
Pt c/o right lower dental pain x 4 days-NAD-steady gait

## 2020-11-20 ENCOUNTER — Other Ambulatory Visit: Payer: Self-pay

## 2020-11-20 ENCOUNTER — Encounter (HOSPITAL_BASED_OUTPATIENT_CLINIC_OR_DEPARTMENT_OTHER): Payer: Self-pay | Admitting: *Deleted

## 2020-11-20 ENCOUNTER — Emergency Department (HOSPITAL_BASED_OUTPATIENT_CLINIC_OR_DEPARTMENT_OTHER)
Admission: EM | Admit: 2020-11-20 | Discharge: 2020-11-20 | Disposition: A | Discharge status: Home / Self Care | Payer: Managed Care, Other (non HMO) | Attending: Emergency Medicine | Admitting: Emergency Medicine

## 2020-11-20 DIAGNOSIS — F17210 Nicotine dependence, cigarettes, uncomplicated: Secondary | ICD-10-CM | POA: Diagnosis not present

## 2020-11-20 DIAGNOSIS — J45909 Unspecified asthma, uncomplicated: Secondary | ICD-10-CM | POA: Diagnosis not present

## 2020-11-20 DIAGNOSIS — K0889 Other specified disorders of teeth and supporting structures: Secondary | ICD-10-CM | POA: Insufficient documentation

## 2020-11-20 MED ORDER — PENICILLIN V POTASSIUM 500 MG PO TABS: 500.0000 mg | ORAL_TABLET | Freq: Four times a day (QID) | ORAL | 0 refills | Status: AC

## 2020-11-20 NOTE — ED Triage Notes (Signed)
Pt reports broken tooth right lower side, pain x 2 days

## 2020-11-20 NOTE — ED Notes (Signed)
Pt verbalized understanding to pick up prescriptions at pharmacy listed on d/c instructions.  °

## 2020-11-20 NOTE — Discharge Instructions (Signed)
Take antibiotics as directed. Please take all of your antibiotics until finished.  You can take Tylenol or Ibuprofen as directed for pain. You can alternate Tylenol and Ibuprofen every 4 hours. If you take Tylenol at 1pm, then you can take Ibuprofen at 5pm. Then you can take Tylenol again at 9pm.   The exam and treatment you received today has been provided on an emergency basis only. This is not a substitute for complete medical or dental care. If your problem worsens or new symptoms (problems) appear, and you are unable to arrange prompt follow-up care with your dentist, call or return to this location. If you do not have a dentist, please follow-up with one on the list provided  CALL YOUR DENTIST OR RETURN IMMEDIATELY IF you develop a fever, rash, difficulty breathing or swallowing, neck or facial swelling, or other potentially serious concerns.   Please follow-up with one of the dental clinics provided to you below or in your paperwork. Call and tell them you were seen in the Emergency Dept and arrange for an appointment. You may have to call multiple places in order to find a place to be seen.  Dental Assistance If the dentist on-call cannot see you, please use the resources below:   Patients with Medicaid: [facility] [address], [phone], [phone]  If unable to pay, or uninsured, contact [facility] (271-5999) or [facility] ([phone] in [place], [phone] in [place]) to become qualified for the adult dental clinic  Other Low-Cost Community Dental Services: [facility]- [address], [place], [place], [address]    [phone], Ext. [phone]    2nd and 4th Thursday of the month at 6:30am    10 clients each day by appointment, can sometimes see walk-in     patients if someone does not show for an appointment [facility]- [address], [place], [place], [address]    [phone]  [facility]- [address], [place], [place], [address]    [phone]  [facility]- [phone] [facility]- [phone] [facility]- [phone]  

## 2020-11-20 NOTE — ED Provider Notes (Signed)
[facility] EMERGENCY DEPARTMENT Provider Note   CSN: [id number] Arrival date & time: 11/20/20  1351     History Chief Complaint  Patient presents with  . Dental Pain    Miguel Gallegos is a 35 y.o. male with PMH/o Asthma, Seizures, who presents for evaluation of right lower dental pain x2 days.  He states that the tooth is partially cracked and has been causing him issues.  He states that he has not any fever, facial swelling, difficulty breathing, difficulty swallowing.  He does not see a dentist.  The history is provided by the patient.       Past Medical History:  Diagnosis Date  . Anxiety   . Asthma   . Seizures (HCC)    last seizure 2005    There are no problems to display for this patient.   History reviewed. No pertinent surgical history.     No family history on file.  Social History   Tobacco Use  . Smoking status: Current Every Day Smoker    Packs/day: 0.00    Types: Cigarettes  . Smokeless tobacco: Never Used  Vaping Use  . Vaping Use: Never used  Substance Use Topics  . Alcohol use: Yes    Comment: occ  . Drug use: Not Currently    Home Medications Prior to Admission medications   Medication Sig Start Date End Date Taking? Authorizing Provider  albuterol (PROVENTIL HFA;VENTOLIN HFA) 108 (90 Base) MCG/ACT inhaler Inhale 2 puffs into the lungs every 4 (four) hours as needed for wheezing or shortness of breath. 12/19/16  Yes Ward, Layla Maw, DO  penicillin v potassium (VEETID) 500 MG tablet Take 1 tablet (500 mg total) by mouth 4 (four) times daily for 7 days. 11/20/20 11/27/20 Yes Maxwell Caul, PA-C  acetaminophen (TYLENOL) 500 MG tablet Take 500 mg by mouth daily as needed for mild pain.    [provider]  albuterol (VENTOLIN HFA) 108 (90 Base) MCG/ACT inhaler Inhale 2 puffs into the lungs every 6 (six) hours as needed for wheezing or shortness of breath.    [provider]  ibuprofen (ADVIL) 200 MG tablet Take 400 mg by  mouth 2 (two) times daily as needed for moderate pain.    [provider]  ibuprofen (ADVIL,MOTRIN) 800 MG tablet Take 1 tablet (800 mg total) by mouth 3 (three) times daily. 12/01/17   Gilda Crease, MD    Allergies    Patient has no known allergies.  Review of Systems   Review of Systems  Constitutional: Negative for fever.  HENT: Positive for dental problem. Negative for trouble swallowing.   Respiratory: Negative for shortness of breath.   Gastrointestinal: Negative for vomiting.  All other systems reviewed and are negative.   Physical Exam Updated Vital Signs BP 125/83 (BP Location: Left Arm)   Pulse 90   Temp 98 F (36.7 C) (Oral)   Resp 16   Ht 5\' 7"  (1.702 m)   Wt 88.5 kg   SpO2 94%   BMI 30.54 kg/m   Physical Exam Vitals and nursing note reviewed.  Constitutional:      Appearance: He is well-developed.  HENT:     Head: Normocephalic and atraumatic.     Comments: Face is symmetric in appearance without any overlying warmth, erythema, edema.    Mouth/Throat:      Comments: Posterior oropharynx is clear.  No trismus.  Posterior molar on the right lower side is partially cracked with some surrounding  synovial irritation.  No obvious identifiable abscess.  No submandibular swelling. Eyes:     General: No scleral icterus.       Right eye: No discharge.        Left eye: No discharge.     Conjunctiva/sclera: Conjunctivae normal.  Pulmonary:     Effort: Pulmonary effort is normal.  Skin:    General: Skin is warm and dry.  Neurological:     Mental Status: He is alert.  Psychiatric:        Speech: Speech normal.        Behavior: Behavior normal.     ED Results / Procedures / Treatments   Labs (all labs ordered are listed, but only abnormal results are displayed) Labs Reviewed - No data to display  EKG None  Radiology No results found.  Procedures Procedures    Medications Ordered in ED Medications - No data to display  ED Course   I have reviewed the triage vital signs and the nursing notes.  Pertinent labs & imaging results that were available during my care of the patient were reviewed by me and considered in my medical decision making (see chart for details).    MDM Rules/Calculators/A&P                          35 yo M presents with 2 days of dental pain. Patient is afebrile, non-toxic appearing, sitting comfortably on examination table. Vital signs reviewed and stable.  No evidence of abscess requiring immediate incision and drainage. Exam not concerning for Ludwig's angina or pharyngeal abscess. Will treat with PCN . Patient instructed to follow-up with dentist referral provided. Stable for discharge at this time. Strict return precautions discussed. Patient expresses understanding and agreement to plan.  Portions of this note were generated with Scientist, clinical (histocompatibility and immunogenetics). Dictation errors may occur despite best attempts at proofreading  Final Clinical Impression(s) / ED Diagnoses Final diagnoses:  Pain, dental    Rx / DC Orders ED Discharge Orders         Ordered    penicillin v potassium (VEETID) 500 MG tablet  4 times daily        11/20/20 1416           Rosana Hoes 11/20/20 1418    Gwyneth Sprout, MD 11/20/20 1424

## 2021-05-31 ENCOUNTER — Encounter (HOSPITAL_BASED_OUTPATIENT_CLINIC_OR_DEPARTMENT_OTHER): Payer: Self-pay

## 2021-05-31 ENCOUNTER — Emergency Department (HOSPITAL_BASED_OUTPATIENT_CLINIC_OR_DEPARTMENT_OTHER): Payer: Self-pay

## 2021-05-31 ENCOUNTER — Other Ambulatory Visit: Payer: Self-pay

## 2021-05-31 ENCOUNTER — Emergency Department (HOSPITAL_BASED_OUTPATIENT_CLINIC_OR_DEPARTMENT_OTHER)
Admission: EM | Admit: 2021-05-31 | Discharge: 2021-05-31 | Disposition: A | Discharge status: Home / Self Care | Payer: Self-pay | Attending: Emergency Medicine | Admitting: Emergency Medicine

## 2021-05-31 DIAGNOSIS — J45909 Unspecified asthma, uncomplicated: Secondary | ICD-10-CM | POA: Insufficient documentation

## 2021-05-31 DIAGNOSIS — M545 Low back pain, unspecified: Secondary | ICD-10-CM | POA: Insufficient documentation

## 2021-05-31 DIAGNOSIS — F17210 Nicotine dependence, cigarettes, uncomplicated: Secondary | ICD-10-CM | POA: Diagnosis not present

## 2021-05-31 LAB — URINALYSIS, ROUTINE W REFLEX MICROSCOPIC
Bilirubin Urine: NEGATIVE
Glucose, UA: NEGATIVE mg/dL
Hgb urine dipstick: NEGATIVE
Ketones, ur: NEGATIVE mg/dL
Leukocytes,Ua: NEGATIVE
Nitrite: NEGATIVE
Protein, ur: NEGATIVE mg/dL
Specific Gravity, Urine: >1.03 — ABNORMAL HIGH (ref 1.005–1.030)
pH: 5.5 (ref 5.0–8.0)

## 2021-05-31 MED ORDER — KETOROLAC TROMETHAMINE 30 MG/ML IJ SOLN: 30.0000 mg | Freq: Once | INTRAMUSCULAR | Status: DC

## 2021-05-31 MED ORDER — LIDOCAINE 5 % EX PTCH: 1.0000 | MEDICATED_PATCH | CUTANEOUS | 0 refills | Status: DC

## 2021-05-31 MED ORDER — METHOCARBAMOL 500 MG PO TABS: 500.0000 mg | ORAL_TABLET | Freq: Two times a day (BID) | ORAL | 0 refills | Status: DC

## 2021-05-31 NOTE — Discharge Instructions (Addendum)
The x-ray of your spine was unremarkable.  As we discussed this was to evaluate for any bony lesions which are not present.  This does not show the disc spaces.  Her symptoms are more consistent with a herniated disc than a muscle strain however I have provided you with a muscle relaxer for pain control and I would like you to follow-up with your primary care doctor  I have also provided you the information for a neurosurgery clinic.  You may call to make an appointment with them.  Please use Tylenol or ibuprofen for pain.  You may use 600 mg ibuprofen every 6 hours or 1000 mg of Tylenol every 6 hours.  You may choose to alternate between the 2.  This would be most effective.  Not to exceed 4 g of Tylenol within 24 hours.  Not to exceed 3200 mg ibuprofen 24 hours.

## 2021-05-31 NOTE — ED Provider Notes (Signed)
[facility] EMERGENCY DEPARTMENT Provider Note   CSN: [id number] Arrival date & time: 05/31/21  1339     History Chief Complaint  Patient presents with   Back Pain    Miguel Gallegos is a 35 y.o. male.  HPI Patient is a 35 year old male with past medical history significant for anxiety asthma seizures  Use presented to the ER today with complaints of low back pain for 3 months he states that when it initially came on he thought it was because he had slept in his bed funny.  He states that it is an achy pain that is not constant but rather hurts when he bends over.  He denies any numbness or weakness in his legs.  Denies any trauma to his back denies any history of IV drug use no fevers.  Denies any new heavy lifting or new exercises.  Denies any history of back injuries or issues.  States that his wife had similar issues when she had a herniated disc in the past.    No other associate symptoms.  No aggravating mitigating factors apart from worse with bending over and absent at rest.     Past Medical History:  Diagnosis Date   Anxiety    Asthma    Seizures (HCC)    last seizure 2005    There are no problems to display for this patient.   History reviewed. No pertinent surgical history.     No family history on file.  Social History   Tobacco Use   Smoking status: Every Day    Packs/day: 0.00    Types: Cigarettes   Smokeless tobacco: Never  Vaping Use   Vaping Use: Never used  Substance Use Topics   Alcohol use: Yes    Comment: occ   Drug use: Not Currently    Home Medications Prior to Admission medications   Medication Sig Start Date End Date Taking? Authorizing Provider  lidocaine (LIDODERM) 5 % Place 1 patch onto the skin daily. Remove & Discard patch within 12 hours or as directed by MD 05/31/21  Yes Sydne Krahl, Rodrigo Ran, PA  methocarbamol (ROBAXIN) 500 MG tablet Take 1 tablet (500 mg total) by mouth 2 (two) times daily. 05/31/21  Yes Elnathan Fulford,  Stevphen Meuse S, PA  acetaminophen (TYLENOL) 500 MG tablet Take 500 mg by mouth daily as needed for mild pain.    [provider]  albuterol (PROVENTIL HFA;VENTOLIN HFA) 108 (90 Base) MCG/ACT inhaler Inhale 2 puffs into the lungs every 4 (four) hours as needed for wheezing or shortness of breath. 12/19/16   Ward, Layla Maw, DO  albuterol (VENTOLIN HFA) 108 (90 Base) MCG/ACT inhaler Inhale 2 puffs into the lungs every 6 (six) hours as needed for wheezing or shortness of breath.    [provider]  ibuprofen (ADVIL) 200 MG tablet Take 400 mg by mouth 2 (two) times daily as needed for moderate pain.    [provider]  ibuprofen (ADVIL,MOTRIN) 800 MG tablet Take 1 tablet (800 mg total) by mouth 3 (three) times daily. 12/01/17   Gilda Crease, MD    Allergies    Patient has no known allergies.  Review of Systems   Review of Systems  Constitutional:  Negative for chills and fever.  HENT:  Negative for congestion.   Eyes:  Negative for pain.  Respiratory:  Negative for cough and shortness of breath.   Cardiovascular:  Negative for chest pain and leg swelling.  Gastrointestinal:  Negative for abdominal  pain and vomiting.  Genitourinary:  Negative for dysuria.  Musculoskeletal:  Positive for back pain. Negative for myalgias.  Skin:  Negative for rash.  Neurological:  Negative for dizziness and headaches.   Physical Exam Updated Vital Signs BP 134/89 (BP Location: Right Arm)   Pulse 87   Temp 97.9 F (36.6 C) (Oral)   Resp 18   Ht 5\' 7"  (1.702 m)   Wt 92.5 kg   SpO2 99%   BMI 31.95 kg/m   Physical Exam Vitals and nursing note reviewed.  Constitutional:      General: He is not in acute distress.    Appearance: Normal appearance. He is not ill-appearing.  HENT:     Head: Normocephalic and atraumatic.     Nose: Nose normal.     Mouth/Throat:     Mouth: Mucous membranes are moist.  Eyes:     General: No scleral icterus.       Right eye: No discharge.         Left eye: No discharge.     Conjunctiva/sclera: Conjunctivae normal.  Cardiovascular:     Rate and Rhythm: Normal rate and regular rhythm.     Pulses: Normal pulses.     Heart sounds: Normal heart sounds.  Pulmonary:     Effort: Pulmonary effort is normal. No respiratory distress.     Breath sounds: No stridor. No wheezing.  Abdominal:     Palpations: Abdomen is soft.     Tenderness: There is no abdominal tenderness. There is no right CVA tenderness, left CVA tenderness, guarding or rebound.  Musculoskeletal:     Cervical back: Normal range of motion.     Right lower leg: No edema.     Left lower leg: No edema.     Comments: Some diffuse TTP of the lumbar spine and surrounding musculature.  No step-off deformity or focal bony tenderness.  Skin:    General: Skin is warm and dry.     Capillary Refill: Capillary refill takes less than 2 seconds.  Neurological:     Mental Status: He is alert and oriented to person, place, and time. Mental status is at baseline.  Psychiatric:        Mood and Affect: Mood normal.        Behavior: Behavior normal.    ED Results / Procedures / Treatments   Labs (all labs ordered are listed, but only abnormal results are displayed) Labs Reviewed  URINALYSIS, ROUTINE W REFLEX MICROSCOPIC - Abnormal; Notable for the following components:      Result Value   Specific Gravity, Urine >1.030 (*)    All other components within normal limits    EKG None  Radiology DG Lumbar Spine Complete  Result Date: 05/31/2021 CLINICAL DATA:  Lower back pain after lifting heavy boxes yesterday. EXAM: LUMBAR SPINE - COMPLETE 4+ VIEW COMPARISON:  None. FINDINGS: There is no evidence of lumbar spine fracture. Alignment is normal. Intervertebral disc spaces are maintained. IMPRESSION: Negative. Electronically Signed   By: 06/02/2021 M.D.   On: 05/31/2021 15:20    Procedures Procedures   Medications Ordered in ED Medications - No data to display  ED Course   I have reviewed the triage vital signs and the nursing notes.  Pertinent labs & imaging results that were available during my care of the patient were reviewed by me and considered in my medical decision making (see chart for details).    MDM Rules/Calculators/A&P  Patient is a 35 year old male presented to the ER today with nontraumatic low back pain has been ongoing for 3 months he states it is gradually worsening.  Has not had any inciting events but states that he does feel that he slept weirdly the first day that he was having pain.  He denies any radiation of pain down his legs he denies any numbness or weakness in his extremities no difficulty walking no bowel or bladder incontinence.  Broad differential for back pain considered includes malignancy, disc herniation, spinal epidural abscess, spinal fracture, cauda equina, pyelonephritis, kidney stone, AAA, AD, pancreatitis, PE and PTX.   History without symptoms of urinary or stool retention or incontinence, neurologic changes such as sensation change or weakness lower extremities, coagulopathy or blood thinner use, is not elderly or with history of osteoporosis, denies any history of cancer, fever, IV drug use, weight changes (unexplained), or prolonged steroid use.   Physical exam most consistent with muscular strain. Doubt cauda equina or disc herniation d/t lack of saddle anesthesia/bowel or bladder incontinence or urinary retention, normal gait and reassuring physical examination without neurologic deficits.   History is not supportive of kidney stone, AAA, AD, pancreatitis, PE or PTX. Patient has no CVA tenderness or urinary sx to suggest pyelonephritis or kidney stone.   Will manage patient conservatively at this time. NSAIDs, back exercises/stretches, heat therapy and follow up with PCP if symptoms do not resolve in 3-4 weeks. Patient offered muscle relaxer for comfort at night. Counseled on need to return to  ED for fever, worsening or concerning symptoms. Patient agreeable to plan and states understanding of follow up plans and return precautions.    Vitals WNL at time of discharge and patient is no acute distress--does appear uncomfortable.  Offered Toradol shot which she declines  Urinalysis unremarkable. Doubt nephrolithiasis  Will discharge home at this time.  Patient ambulatory without difficulty at time of discharge.  Will send home with Lidoderm patches and Robaxin.  Final Clinical Impression(s) / ED Diagnoses Final diagnoses:  Acute low back pain without sciatica, unspecified back pain laterality    Rx / DC Orders ED Discharge Orders          Ordered    methocarbamol (ROBAXIN) 500 MG tablet  2 times daily        05/31/21 1602    lidocaine (LIDODERM) 5 %  Every 24 hours        05/31/21 1620             Gailen Shelter, [place] 05/31/21 1812    Milagros Loll, MD 05/31/21 [phone]

## 2021-05-31 NOTE — ED Triage Notes (Signed)
Pt c/o lower back pain x 3 months-denies injury-NAD-steady gait

## 2021-05-31 NOTE — ED Notes (Signed)
Pt verbalized understanding of all DC orders, no signature pad at bedside

## 2022-02-21 ENCOUNTER — Emergency Department (HOSPITAL_BASED_OUTPATIENT_CLINIC_OR_DEPARTMENT_OTHER)
Admission: EM | Admit: 2022-02-21 | Discharge: 2022-02-21 | Disposition: A | Discharge status: Home / Self Care | Payer: Self-pay | Attending: Emergency Medicine | Admitting: Emergency Medicine

## 2022-02-21 ENCOUNTER — Encounter (HOSPITAL_BASED_OUTPATIENT_CLINIC_OR_DEPARTMENT_OTHER): Payer: Self-pay | Admitting: Pediatrics

## 2022-02-21 ENCOUNTER — Other Ambulatory Visit: Payer: Self-pay

## 2022-02-21 ENCOUNTER — Emergency Department (HOSPITAL_BASED_OUTPATIENT_CLINIC_OR_DEPARTMENT_OTHER): Payer: PRIVATE HEALTH INSURANCE

## 2022-02-21 DIAGNOSIS — R0789 Other chest pain: Secondary | ICD-10-CM | POA: Insufficient documentation

## 2022-02-21 LAB — CBC
HCT: 47.5 % (ref 39.0–52.0)
Hemoglobin: 15.8 g/dL (ref 13.0–17.0)
MCH: 27.8 pg (ref 26.0–34.0)
MCHC: 33.3 g/dL (ref 30.0–36.0)
MCV: 83.6 fL (ref 80.0–100.0)
Platelets: 236 10*3/uL (ref 150–400)
RBC: 5.68 MIL/uL (ref 4.22–5.81)
RDW: 13.6 % (ref 11.5–15.5)
WBC: 6.1 10*3/uL (ref 4.0–10.5)
nRBC: 0 % (ref 0.0–0.2)

## 2022-02-21 LAB — BASIC METABOLIC PANEL
Anion gap: 4 — ABNORMAL LOW (ref 5–15)
BUN: 17 mg/dL (ref 6–20)
CO2: 26 mmol/L (ref 22–32)
Calcium: 8.9 mg/dL (ref 8.9–10.3)
Chloride: 112 mmol/L — ABNORMAL HIGH (ref 98–111)
Creatinine, Ser: 0.86 mg/dL (ref 0.61–1.24)
GFR, Estimated: >60 mL/min (ref >60)
Glucose, Bld: 97 mg/dL (ref 70–99)
Potassium: 4.3 mmol/L (ref 3.5–5.1)
Sodium: 142 mmol/L (ref 135–145)

## 2022-02-21 LAB — TROPONIN I (HIGH SENSITIVITY): Troponin I (High Sensitivity): <2 ng/L (ref <18)

## 2022-02-21 MED ORDER — KETOROLAC TROMETHAMINE 15 MG/ML IJ SOLN
15.0000 mg | Freq: Once | INTRAMUSCULAR | Status: AC
  Administered 2022-02-21: 15 mg via INTRAVENOUS
  Filled 2022-02-21: qty 1

## 2022-02-21 NOTE — ED Triage Notes (Signed)
C/O sharp pains on upper left side of chest radiating through the back along painful deep breathes started last night while at work; patient stated "I think I pinched my nerves again";

## 2022-02-21 NOTE — ED Provider Notes (Signed)
[facility] EMERGENCY DEPARTMENT Provider Note   CSN: [id number] Arrival date & time: 02/21/22  0920     History  Chief Complaint  Patient presents with   Chest Pain    Skippy Marhefka is a 36 y.o. male.  HPI 36 year old male presents with chest pain.  Started last night at work at around 6 PM.  He is a Production designer, theatre/television/film and occasionally lifts heavy things but states he did not remember any specific injury.  The pain is sharp and is in his left superior and anterior chest.  He is also having pain in the same area in his back.  Deep breaths make it worse as well as any type of movement such as sitting up, bending, etc.  He took some Tylenol last night but it did not help.  Pain has remained constant.  No dyspnea.  No leg swelling or history of DVT.  Home Medications Prior to Admission medications   Medication Sig Start Date End Date Taking? Authorizing Provider  acetaminophen (TYLENOL) 500 MG tablet Take 500 mg by mouth daily as needed for mild pain.    [provider]  albuterol (PROVENTIL HFA;VENTOLIN HFA) 108 (90 Base) MCG/ACT inhaler Inhale 2 puffs into the lungs every 4 (four) hours as needed for wheezing or shortness of breath. 12/19/16   Ward, Layla Maw, DO  albuterol (VENTOLIN HFA) 108 (90 Base) MCG/ACT inhaler Inhale 2 puffs into the lungs every 6 (six) hours as needed for wheezing or shortness of breath.    [provider]  ibuprofen (ADVIL) 200 MG tablet Take 400 mg by mouth 2 (two) times daily as needed for moderate pain.    [provider]  ibuprofen (ADVIL,MOTRIN) 800 MG tablet Take 1 tablet (800 mg total) by mouth 3 (three) times daily. 12/01/17   Gilda Crease, MD  lidocaine (LIDODERM) 5 % Place 1 patch onto the skin daily. Remove & Discard patch within 12 hours or as directed by MD 05/31/21   Gailen Shelter, PA  methocarbamol (ROBAXIN) 500 MG tablet Take 1 tablet (500 mg total) by mouth 2 (two) times daily. 05/31/21   Gailen Shelter, PA       Allergies    Patient has no known allergies.    Review of Systems   Review of Systems  Constitutional:  Negative for fever.  Respiratory:  Negative for cough and shortness of breath.   Cardiovascular:  Positive for chest pain. Negative for leg swelling.  Gastrointestinal:  Negative for abdominal pain.    Physical Exam Updated Vital Signs BP 118/67 (BP Location: Left Arm)   Pulse 75   Temp 97.6 F (36.4 C) (Oral)   Resp 16   Ht 5\' 7"  (1.702 m)   Wt 93 kg   SpO2 97%   BMI 32.11 kg/m  Physical Exam Vitals and nursing note reviewed.  Constitutional:      General: He is not in acute distress.    Appearance: He is well-developed. He is not ill-appearing.  HENT:     Head: Normocephalic and atraumatic.  Cardiovascular:     Rate and Rhythm: Normal rate and regular rhythm.     Heart sounds: Normal heart sounds.  Pulmonary:     Effort: Pulmonary effort is normal.     Breath sounds: Normal breath sounds.  Chest:     Chest wall: No tenderness.     Comments: No chest or back tenderness. However moving the left shoulder induces this same pain. Abdominal:  Palpations: Abdomen is soft.     Tenderness: There is no abdominal tenderness.  Musculoskeletal:     Right lower leg: No tenderness. No edema.     Left lower leg: No tenderness. No edema.  Skin:    General: Skin is warm and dry.  Neurological:     Mental Status: He is alert.     ED Results / Procedures / Treatments   Labs (all labs ordered are listed, but only abnormal results are displayed) Labs Reviewed  BASIC METABOLIC PANEL - Abnormal; Notable for the following components:      Result Value   Chloride 112 (*)    Anion gap 4 (*)    All other components within normal limits  CBC  TROPONIN I (HIGH SENSITIVITY)    EKG EKG Interpretation  Date/Time:  Wednesday February 21 2022 09:28:30 EDT Ventricular Rate:  76 PR Interval:  147 QRS Duration: 92 QT Interval:  363 QTC Calculation: 409 R Axis:   42 Text  Interpretation: Sinus rhythm no acute ST/T changes no significant change since 2016 Confirmed by Pricilla Loveless [phone]) on 02/21/2022 9:30:47 AM  Radiology DG Chest 2 View  Result Date: 02/21/2022 CLINICAL DATA:  Left chest pain since last night. History of asthma. EXAM: CHEST - 2 VIEW COMPARISON:  12/31/2019 FINDINGS: Normal sized heart.  Clear lungs.  Unremarkable bones. IMPRESSION: Normal examination. Electronically Signed   By: Beckie Salts M.D.   On: 02/21/2022 09:56    Procedures Procedures    Medications Ordered in ED Medications  ketorolac (TORADOL) 15 MG/ML injection 15 mg (15 mg Intravenous Given 02/21/22 0954)    ED Course/ Medical Decision Making/ A&P                           Medical Decision Making Amount and/or Complexity of Data Reviewed Labs: ordered.    Details: troponin <2 after 12+ hours of continuous symptoms. WBC/hgb normal. electrolyes ok besides mild elevated Cl Radiology: ordered and independent interpretation performed.    Details: no pneumothorax/pneumonia ECG/medicine tests: independent interpretation performed.    Details: no acute ischemia  Risk Prescription drug management.   I suspect this is a chest wall pain based on how sensitive it is to certain movements/positions. No significant DVT/PE risk factors. PERC negative. Low suspicion for ACS, dissection, PE, etc. Given IV toradol here. Will  recommend NSAIDs. Otherwise given return precautions. Stable for d/c home, follow up with PCP.        Final Clinical Impression(s) / ED Diagnoses Final diagnoses:  Chest wall pain    Rx / DC Orders ED Discharge Orders     None         Pricilla Loveless, MD 02/21/22 1515

## 2022-02-21 NOTE — Discharge Instructions (Addendum)
Take ibuprofen (preferred) and Tylenol for your pain/discomfort.  You may also apply ice and/or heat.  If you develop recurrent, continued, or worsening chest pain, shortness of breath, fever, vomiting, abdominal or back pain, or any other new/concerning symptoms then return to the ER for evaluation.

## 2022-03-05 ENCOUNTER — Emergency Department (HOSPITAL_BASED_OUTPATIENT_CLINIC_OR_DEPARTMENT_OTHER)
Admission: EM | Admit: 2022-03-05 | Discharge: 2022-03-05 | Disposition: A | Discharge status: Home / Self Care | Payer: Self-pay | Attending: Emergency Medicine | Admitting: Emergency Medicine

## 2022-03-05 ENCOUNTER — Encounter (HOSPITAL_BASED_OUTPATIENT_CLINIC_OR_DEPARTMENT_OTHER): Payer: Self-pay | Admitting: Emergency Medicine

## 2022-03-05 ENCOUNTER — Emergency Department (HOSPITAL_BASED_OUTPATIENT_CLINIC_OR_DEPARTMENT_OTHER): Payer: Self-pay

## 2022-03-05 ENCOUNTER — Other Ambulatory Visit: Payer: Self-pay

## 2022-03-05 DIAGNOSIS — W010XXA Fall on same level from slipping, tripping and stumbling without subsequent striking against object, initial encounter: Secondary | ICD-10-CM | POA: Insufficient documentation

## 2022-03-05 DIAGNOSIS — S6991XA Unspecified injury of right wrist, hand and finger(s), initial encounter: Secondary | ICD-10-CM | POA: Insufficient documentation

## 2022-03-05 MED ORDER — IBUPROFEN 800 MG PO TABS
800.0000 mg | ORAL_TABLET | Freq: Once | ORAL | Status: AC
  Administered 2022-03-05: 800 mg via ORAL
  Filled 2022-03-05: qty 1

## 2022-03-05 NOTE — ED Triage Notes (Signed)
Patient states he tripped and fell pta and caught himself with his right hand. Patient c/o right thumb pain.

## 2022-03-05 NOTE — ED Provider Notes (Signed)
[facility] EMERGENCY DEPARTMENT Provider Note   CSN: [id number] Arrival date & time: 03/05/22  1934     History  Chief Complaint  Patient presents with   Finger Injury    Miguel Gallegos is a 36 y.o. male presenting with right thumb pain.  Reports he was walking earlier when he tripped and fell onto his outstretched hand.  Notes pain over the right thumb.  No numbness or tingling.  Tender to palpation with some decrease in range of motion secondary to swelling.  HPI     Home Medications Prior to Admission medications   Medication Sig Start Date End Date Taking? Authorizing Provider  acetaminophen (TYLENOL) 500 MG tablet Take 500 mg by mouth daily as needed for mild pain.    [provider]  albuterol (PROVENTIL HFA;VENTOLIN HFA) 108 (90 Base) MCG/ACT inhaler Inhale 2 puffs into the lungs every 4 (four) hours as needed for wheezing or shortness of breath. 12/19/16   Ward, Layla Maw, DO  albuterol (VENTOLIN HFA) 108 (90 Base) MCG/ACT inhaler Inhale 2 puffs into the lungs every 6 (six) hours as needed for wheezing or shortness of breath.    [provider]  ibuprofen (ADVIL) 200 MG tablet Take 400 mg by mouth 2 (two) times daily as needed for moderate pain.    [provider]  ibuprofen (ADVIL,MOTRIN) 800 MG tablet Take 1 tablet (800 mg total) by mouth 3 (three) times daily. 12/01/17   Gilda Crease, MD  lidocaine (LIDODERM) 5 % Place 1 patch onto the skin daily. Remove & Discard patch within 12 hours or as directed by MD 05/31/21   Gailen Shelter, PA  methocarbamol (ROBAXIN) 500 MG tablet Take 1 tablet (500 mg total) by mouth 2 (two) times daily. 05/31/21   Gailen Shelter, PA      Allergies    Patient has no known allergies.    Review of Systems   Review of Systems  Physical Exam Updated Vital Signs BP (!) 139/93 (BP Location: Left Arm)   Pulse 86   Temp 98.7 F (37.1 C) (Oral)   Resp 16   Ht 5\' 7"  (1.702 m)   Wt 88.5 kg    SpO2 99%   BMI 30.54 kg/m  Physical Exam Vitals and nursing note reviewed.  Constitutional:      Appearance: Normal appearance.  HENT:     Head: Normocephalic and atraumatic.  Eyes:     General: No scleral icterus.    Conjunctiva/sclera: Conjunctivae normal.  Pulmonary:     Effort: Pulmonary effort is normal. No respiratory distress.  Musculoskeletal:     Comments: Tenderness over the first MCP and PIP of the right hand.  Mild swelling.  Normal cap refill, neurovascularly intact.  Range of motion intact, some limitation secondary to pain however passive range of motion without concerns.  Skin:    Findings: No rash.  Neurological:     Mental Status: He is alert.  Psychiatric:        Mood and Affect: Mood normal.     ED Results / Procedures / Treatments   Labs (all labs ordered are listed, but only abnormal results are displayed) Labs Reviewed - No data to display  EKG None  Radiology DG Hand Complete Right  Result Date: 03/05/2022 CLINICAL DATA:  Fall, right thumb pain EXAM: RIGHT HAND - COMPLETE 3+ VIEW COMPARISON:  None Available. FINDINGS: There is no evidence of fracture or dislocation. There is no evidence of arthropathy or  other focal bone abnormality. Soft tissues are unremarkable. IMPRESSION: Negative. Electronically Signed   By: Charlett Nose M.D.   On: 03/05/2022 20:08    Procedures Procedures   Medications Ordered in ED Medications  ibuprofen (ADVIL) tablet 800 mg (800 mg Oral Given 03/05/22 2206)    ED Course/ Medical Decision Making/ A&P                           Medical Decision Making Amount and/or Complexity of Data Reviewed Radiology: ordered.  Risk Prescription drug management.   36 year old male presenting today after falling and injuring his right thumb.  Physical exam benign, neurovascularly intact.  Full range of motion low suspicion tenosynovitis.  Imaging ordered in triage.  Reviewed and interpreted by me.  No signs of fracture or  dislocation.  There is swelling around the thumb.  Treatment: Given ibuprofen for pain and swelling  MDM/disposition: Patient presents after falling onto his right hand.  Complaining of pain over his thumb.  Tenderness localized to MCP and PIP of the thumb, no other tenderness in the hand.  No signs of tenosynovitis or trigger finger.  He will follow-up outpatient with sports medicine/orthopedics as needed however at this point I believe he is ligamental strain.  Potentially ulnar collateral ligament.  Doubt complete tear due to intact range of motion.  Will be placed and thumb spica Velcro brace and given instructions for RICE therapy.  He is agreeable to the plan.  Return precautions discussed and he voiced understanding.  Ambulated out of the department neurovascularly intact.   Final Clinical Impression(s) / ED Diagnoses Final diagnoses:  Injury of finger of right hand, initial encounter    Rx / DC Orders ED Discharge Orders     None      Results and diagnoses were explained to the patient. Return precautions discussed in full. Patient had no additional questions and expressed complete understanding.   This chart was dictated using voice recognition software.  Despite best efforts to proofread,  errors can occur which can change the documentation meaning.    Woodroe Chen 03/05/22 2313    Tegeler, Canary Brim, MD 03/05/22 [phone]

## 2022-03-05 NOTE — Discharge Instructions (Signed)
Dr. Jordan Likes is an orthopedic doctor who you may follow-up with if you are not getting better in a week.  Ice and ibuprofen are good options for pain and swelling.  Use the brace whenever you are active to support your thumb.  It was a pleasure to meet you and I hope that you feel better.

## 2022-09-29 ENCOUNTER — Emergency Department (HOSPITAL_BASED_OUTPATIENT_CLINIC_OR_DEPARTMENT_OTHER)
Admission: EM | Admit: 2022-09-29 | Discharge: 2022-09-29 | Disposition: A | Discharge status: Home / Self Care | Payer: Self-pay | Attending: Emergency Medicine | Admitting: Emergency Medicine

## 2022-09-29 ENCOUNTER — Emergency Department (HOSPITAL_BASED_OUTPATIENT_CLINIC_OR_DEPARTMENT_OTHER): Payer: Self-pay

## 2022-09-29 ENCOUNTER — Encounter (HOSPITAL_BASED_OUTPATIENT_CLINIC_OR_DEPARTMENT_OTHER): Payer: Self-pay | Admitting: Urology

## 2022-09-29 ENCOUNTER — Other Ambulatory Visit: Payer: Self-pay

## 2022-09-29 DIAGNOSIS — S46912A Strain of unspecified muscle, fascia and tendon at shoulder and upper arm level, left arm, initial encounter: Secondary | ICD-10-CM | POA: Insufficient documentation

## 2022-09-29 DIAGNOSIS — Y990 Civilian activity done for income or pay: Secondary | ICD-10-CM | POA: Insufficient documentation

## 2022-09-29 DIAGNOSIS — X500XXA Overexertion from strenuous movement or load, initial encounter: Secondary | ICD-10-CM | POA: Insufficient documentation

## 2022-09-29 MED ORDER — LIDOCAINE 5 % EX PTCH: 1.0000 | MEDICATED_PATCH | CUTANEOUS | 0 refills | Status: DC

## 2022-09-29 MED ORDER — METHOCARBAMOL 500 MG PO TABS
500.0000 mg | ORAL_TABLET | Freq: Once | ORAL | Status: AC
  Administered 2022-09-29: 500 mg via ORAL
  Filled 2022-09-29: qty 1

## 2022-09-29 MED ORDER — DEXAMETHASONE SODIUM PHOSPHATE 10 MG/ML IJ SOLN
10.0000 mg | Freq: Once | INTRAMUSCULAR | Status: AC
Start: 2022-09-29 — End: 2022-09-29
  Administered 2022-09-29: 10 mg via INTRAMUSCULAR
  Filled 2022-09-29: qty 1

## 2022-09-29 MED ORDER — METHOCARBAMOL 500 MG PO TABS: 500.0000 mg | ORAL_TABLET | Freq: Two times a day (BID) | ORAL | 0 refills | Status: DC

## 2022-09-29 MED ORDER — KETOROLAC TROMETHAMINE 15 MG/ML IJ SOLN
15.0000 mg | Freq: Once | INTRAMUSCULAR | Status: AC
  Administered 2022-09-29: 15 mg via INTRAMUSCULAR
  Filled 2022-09-29: qty 1

## 2022-09-29 MED ORDER — LIDOCAINE 5 % EX PTCH
1.0000 | MEDICATED_PATCH | Freq: Once | CUTANEOUS | Status: DC
  Administered 2022-09-29: 1 via TRANSDERMAL
  Filled 2022-09-29: qty 1

## 2022-09-29 NOTE — ED Provider Notes (Signed)
[facility] Provider Note   CSN: [id number] Arrival date & time: 09/29/22  [id number]     History  Chief Complaint  Patient presents with   Shoulder Pain    Miguel Gallegos is a 37 y.o. male who presents to the ED with concerns for left shoulder pain onset this morning. Pt notes that he was lifting heavy items at work prior to the onset of his symptoms. Denies recent fall or injury. No meds tried PTA. Denies chest pain, shortness of breath. Has a history of pinched nerve.   The history is provided by the patient. No language interpreter was used.       Home Medications Prior to Admission medications   Medication Sig Start Date End Date Taking? Authorizing Provider  acetaminophen (TYLENOL) 500 MG tablet Take 500 mg by mouth daily as needed for mild pain.    [provider]  albuterol (PROVENTIL HFA;VENTOLIN HFA) 108 (90 Base) MCG/ACT inhaler Inhale 2 puffs into the lungs every 4 (four) hours as needed for wheezing or shortness of breath. 12/19/16   Ward, Delice Bison, DO  albuterol (VENTOLIN HFA) 108 (90 Base) MCG/ACT inhaler Inhale 2 puffs into the lungs every 6 (six) hours as needed for wheezing or shortness of breath.    [provider]  ibuprofen (ADVIL) 200 MG tablet Take 400 mg by mouth 2 (two) times daily as needed for moderate pain.    [provider]  ibuprofen (ADVIL,MOTRIN) 800 MG tablet Take 1 tablet (800 mg total) by mouth 3 (three) times daily. 12/01/17   Orpah Greek, MD  lidocaine (LIDODERM) 5 % Place 1 patch onto the skin daily. Remove & Discard patch within 12 hours or as directed by MD 09/29/22   Francina Beery A, PA-C  methocarbamol (ROBAXIN) 500 MG tablet Take 1 tablet (500 mg total) by mouth 2 (two) times daily. 09/29/22   Nimesh Riolo A, PA-C      Allergies    Patient has no known allergies.    Review of Systems   Review of Systems  All other systems reviewed and are negative.   Physical  Exam Updated Vital Signs BP (!) 138/96 (BP Location: Right Arm)   Pulse 83   Temp 98 F (36.7 C) (Oral)   Resp 18   Ht 5\' 7"  (1.702 m)   Wt 86.2 kg   SpO2 97%   BMI 29.76 kg/m  Physical Exam Vitals and nursing note reviewed.  Constitutional:      General: He is not in acute distress.    Appearance: Normal appearance.  Eyes:     General: No scleral icterus.    Extraocular Movements: Extraocular movements intact.  Cardiovascular:     Rate and Rhythm: Normal rate.  Pulmonary:     Effort: Pulmonary effort is normal. No respiratory distress.  Musculoskeletal:     Cervical back: Neck supple.     Comments: TTP noted to left trapezius. No spinal TTP. TTP noted to thoracic musculature on the left. No TTP noted to anterior left shoulder. Radial pulse intact. Decreased ROM of left shoulder secondary to pain.   Skin:    General: Skin is warm and dry.     Findings: No bruising, erythema or rash.  Neurological:     Mental Status: He is alert.  Psychiatric:        Behavior: Behavior normal.     ED Results / Procedures / Treatments   Labs (all labs ordered  are listed, but only abnormal results are displayed) Labs Reviewed - No data to display  EKG None  Radiology DG Shoulder Left  Result Date: 09/29/2022 CLINICAL DATA:  Shoulder pain EXAM: LEFT SHOULDER - 2+ VIEW COMPARISON:  None Available. FINDINGS: There is no evidence of fracture or dislocation. There is no evidence of arthropathy or other focal bone abnormality. Soft tissues are unremarkable. IMPRESSION: Negative. Electronically Signed   By: Kerby Moors M.D.   On: 09/29/2022 10:40    Procedures Procedures    Medications Ordered in ED Medications  lidocaine (LIDODERM) 5 % 1 patch (1 patch Transdermal Patch Applied 09/29/22 1116)  methocarbamol (ROBAXIN) tablet 500 mg (500 mg Oral Given 09/29/22 1116)  ketorolac (TORADOL) 15 MG/ML injection 15 mg (15 mg Intramuscular Given 09/29/22 1116)  dexamethasone (DECADRON)  injection 10 mg (10 mg Intramuscular Given 09/29/22 1116)    ED Course/ Medical Decision Making/ A&P Clinical Course as of 09/29/22 1303  Sat Sep 29, 2022  1142 Re-evaluated and noted improvement of symptoms with treatment regimen. Discussed discharge treatment plan. Pt agreeable at this time. Pt appears safe for discharge. [SB]    Clinical Course User Index [SB] Chinedum Vanhouten A, PA-C                             Medical Decision Making Amount and/or Complexity of Data Reviewed Radiology: ordered.  Risk Prescription drug management.   Patient with left shoulder pain onset PTA. Vital signs, pt afebrile. On exam, patient with TTP noted to left trapezius. No spinal TTP. TTP noted to thoracic musculature on the left. No TTP noted to anterior left shoulder. Radial pulse intact. Decreased ROM of left shoulder secondary to pain.  Differential diagnosis includes rotator cuff pathology, fracture, dislocation.     Imaging: I ordered imaging studies including left shoulder xray I independently visualized and interpreted imaging which showed: no acute finding I agree with the radiologist interpretation  Medications:  I ordered medication including decadron, toradol, robaxin, lidoderm patch, warm compress for symptom management Reevaluation of the patient after these medicines and interventions, I reevaluated the patient and found that they have improved I have reviewed the patients home medicines and have made adjustments as needed   Disposition: Presentation suspicious left shoulder strain. Doubt fracture, dislocation, herniation at this time. After consideration of the diagnostic results and the patients response to treatment, I feel that the patient would benefit from Discharge home.  Sling provided today. Rx for lidoderm and robaxin today. Work note provided. Supportive care measures and strict return precautions discussed with patient at bedside. Pt acknowledges and verbalizes  understanding. Pt appears safe for discharge. Follow up as indicated in discharge paperwork.    This chart was dictated using voice recognition software, Dragon. Despite the best efforts of this provider to proofread and correct errors, errors may still occur which can change documentation meaning.   Final Clinical Impression(s) / ED Diagnoses Final diagnoses:  Strain of left shoulder, initial encounter    Rx / DC Orders ED Discharge Orders          Ordered    methocarbamol (ROBAXIN) 500 MG tablet  2 times daily        09/29/22 1252    lidocaine (LIDODERM) 5 %  Every 24 hours        09/29/22 1252              Josua Ferrebee A, PA-C 09/29/22 1303  Dorie Rank, MD 09/30/22 0700

## 2022-09-29 NOTE — ED Triage Notes (Signed)
Pt states woke up with am with severe left shoulder pain  States h/o pinched nerve in shoulder  Heavy lifting at work, denies any known injury  Decreased ROM due to pain

## 2022-09-29 NOTE — Discharge Instructions (Addendum)
It was a pleasure taking care of you!   Your x-ray was negative for fracture or dislocation. You will be sent a prescription for Robaxin, do not drive or operate heavy machinery while taking this medication as it can make you sleepy/drowsy. You may take over the counter 600 mg Ibuprofen every 6 hours and alternate with 500 mg Tylenol every 6 hours as needed for pain for no more than 7 days.  You will be provided with a Lidoderm (lidocaine) patch, remove and replace with a new patch every 12 hours.  You will be given a sling today, wear it for only 24 hours and remove it to prevent frozen shoulder. You may apply heat to affected area for up to 15 minutes at a time. Ensure to place a barrier between your skin and the heat. Attached is information for the on  You may follow-up with your primary care provider as needed.  Return to the Emergency Department if you are experiencing increasing/worsening symptoms.

## 2022-09-29 NOTE — ED Notes (Signed)
Patient transported to X-ray 

## 2023-05-20 ENCOUNTER — Emergency Department (HOSPITAL_BASED_OUTPATIENT_CLINIC_OR_DEPARTMENT_OTHER)
Admission: EM | Admit: 2023-05-20 | Discharge: 2023-05-20 | Disposition: A | Discharge status: Home / Self Care | Payer: Self-pay | Attending: Emergency Medicine | Admitting: Emergency Medicine

## 2023-05-20 ENCOUNTER — Other Ambulatory Visit: Payer: Self-pay

## 2023-05-20 ENCOUNTER — Emergency Department (HOSPITAL_BASED_OUTPATIENT_CLINIC_OR_DEPARTMENT_OTHER): Payer: Self-pay

## 2023-05-20 ENCOUNTER — Encounter (HOSPITAL_BASED_OUTPATIENT_CLINIC_OR_DEPARTMENT_OTHER): Payer: Self-pay | Admitting: Pediatrics

## 2023-05-20 DIAGNOSIS — G8929 Other chronic pain: Secondary | ICD-10-CM | POA: Insufficient documentation

## 2023-05-20 DIAGNOSIS — M25512 Pain in left shoulder: Secondary | ICD-10-CM | POA: Insufficient documentation

## 2023-05-20 DIAGNOSIS — J45909 Unspecified asthma, uncomplicated: Secondary | ICD-10-CM | POA: Insufficient documentation

## 2023-05-20 DIAGNOSIS — Z7951 Long term (current) use of inhaled steroids: Secondary | ICD-10-CM | POA: Insufficient documentation

## 2023-05-20 MED ORDER — OXYCODONE-ACETAMINOPHEN 5-325 MG PO TABS
1.0000 | ORAL_TABLET | ORAL | Status: DC | PRN
  Administered 2023-05-20: 1 via ORAL
  Filled 2023-05-20: qty 1

## 2023-05-20 MED ORDER — NAPROXEN 375 MG PO TABS: 375.0000 mg | ORAL_TABLET | Freq: Two times a day (BID) | ORAL | 0 refills | Status: DC

## 2023-05-20 MED ORDER — CYCLOBENZAPRINE HCL 10 MG PO TABS: 10.0000 mg | ORAL_TABLET | Freq: Two times a day (BID) | ORAL | 0 refills | Status: DC | PRN

## 2023-05-20 NOTE — Discharge Instructions (Addendum)
It was a pleasure taking part in your care today.  As we discussed, your x-ray imaging of your left shoulder was unremarkable.  There still could be underlying soft tissue issues that could be causing her discomfort.  Please follow-up with Dr. Jeanice Lim on orthopedics, please call him tomorrow make an appointment to be seen.  He may purchase over-the-counter Salonpas patches at your local pharmacy.  I am also writing a prescription for naproxen which is an NSAID he may take this twice daily.  Also may take Flexeril, a muscle relaxer, twice a day as needed.  Please do not drive or operate heavy machinery while taking muscle relaxer as this will cause you to become slightly drowsy.  Please remain in shoulder sling for comfort.

## 2023-05-20 NOTE — ED Triage Notes (Signed)
States he's been having issue with his left shoulder pain on and off x 3 weeks; worst with activity and lifting;

## 2023-05-20 NOTE — ED Provider Notes (Signed)
EMERGENCY DEPARTMENT AT [facility] Provider Note   CSN: [id number] Arrival date & time: 05/20/23  1501     History  Chief Complaint  Patient presents with   Shoulder Pain    Miguel Gallegos is a 37 y.o. male with medical history of anxiety, asthma and seizures.  The patient presents to the ED for evaluation of left-sided shoulder pain.  Patient has a history of left-sided shoulder pain was seen in this ED in March for the same complaint.  He states he never followed up with orthopedics.  He reports that his shoulder pain did dissipate after a few weeks.  He states that for the last 3 weeks his shoulder pain has been exacerbated.  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  He reports that his shoulder pain is worse when he tries to lift his arm above his head as well as abduct his left shoulder.  He denies any trauma to account for this pain.  He denies everything orthopedics or having arthroplasty to his left shoulder.  Denies numbness or tingling into his left hand, neck pain, nausea, vomiting or fevers.   Shoulder Pain      Home Medications Prior to Admission medications   Medication Sig Start Date End Date Taking? Authorizing Provider  cyclobenzaprine (FLEXERIL) 10 MG tablet Take 1 tablet (10 mg total) by mouth 2 (two) times daily as needed for muscle spasms. 05/20/23  Yes Al Decant, PA-C  naproxen (NAPROSYN) 375 MG tablet Take 1 tablet (375 mg total) by mouth 2 (two) times daily. 05/20/23  Yes Al Decant, PA-C  acetaminophen (TYLENOL) 500 MG tablet Take 500 mg by mouth daily as needed for mild pain.    [provider]  albuterol (PROVENTIL HFA;VENTOLIN HFA) 108 (90 Base) MCG/ACT inhaler Inhale 2 puffs into the lungs every 4 (four) hours as needed for wheezing or shortness of breath. 12/19/16   Ward, Layla Maw, DO  albuterol (VENTOLIN HFA) 108 (90 Base) MCG/ACT  inhaler Inhale 2 puffs into the lungs every 6 (six) hours as needed for wheezing or shortness of breath.    [provider]  ibuprofen (ADVIL) 200 MG tablet Take 400 mg by mouth 2 (two) times daily as needed for moderate pain.    [provider]  ibuprofen (ADVIL,MOTRIN) 800 MG tablet Take 1 tablet (800 mg total) by mouth 3 (three) times daily. 12/01/17   Gilda Crease, MD  lidocaine (LIDODERM) 5 % Place 1 patch onto the skin daily. Remove & Discard patch within 12 hours or as directed by MD 09/29/22   Blue, Soijett A, PA-C  methocarbamol (ROBAXIN) 500 MG tablet Take 1 tablet (500 mg total) by mouth 2 (two) times daily. 09/29/22   Blue, Soijett A, PA-C      Allergies    Patient has no known allergies.    Review of Systems   Review of Systems  Musculoskeletal:  Positive for arthralgias and myalgias.  All other systems reviewed and are negative.   Physical Exam Updated Vital Signs BP (!) 127/96 (BP Location: Left Arm)   Pulse (!) 101   Temp 98.7 F (37.1 C)   Resp 18   Ht 5\' 6"  (1.676 m)   Wt 79.4 kg   SpO2 96%   BMI 28.25 kg/m  Physical Exam Vitals and nursing note reviewed.  Constitutional:      General:  He is not in acute distress.    Appearance: He is well-developed.  HENT:     Head: Normocephalic and atraumatic.  Eyes:     Conjunctiva/sclera: Conjunctivae normal.  Cardiovascular:     Rate and Rhythm: Normal rate and regular rhythm.     Heart sounds: No murmur heard. Pulmonary:     Effort: Pulmonary effort is normal. No respiratory distress.     Breath sounds: Normal breath sounds.  Abdominal:     Palpations: Abdomen is soft.     Tenderness: There is no abdominal tenderness.  Musculoskeletal:        General: No swelling.     Cervical back: Neck supple.     Comments: Limited range of motion secondary to pain.  No obvious deformity to patient shoulder.  Tenderness left trapezius muscle, no centralized cervical spinal tenderness.  Patient has  pain with abduction.  Skin:    General: Skin is warm and dry.     Capillary Refill: Capillary refill takes less than 2 seconds.  Neurological:     Mental Status: He is alert.  Psychiatric:        Mood and Affect: Mood normal.     ED Results / Procedures / Treatments   Labs (all labs ordered are listed, but only abnormal results are displayed) Labs Reviewed - No data to display  EKG None  Radiology DG Shoulder Left  Result Date: 05/20/2023 CLINICAL DATA:  Shoulder pain. EXAM: LEFT SHOULDER - 2+ VIEW COMPARISON:  Radiograph 09/29/2022 FINDINGS: There is no evidence of fracture or dislocation. The alignment and joint spaces are normal. There is no evidence of arthropathy or other focal bone abnormality. Soft tissues are unremarkable. IMPRESSION: Negative radiographs of the left shoulder. Electronically Signed   By: Narda Rutherford M.D.   On: 05/20/2023 16:41    Procedures Procedures   Medications Ordered in ED Medications  oxyCODONE-acetaminophen (PERCOCET/ROXICET) 5-325 MG per tablet 1 tablet (1 tablet Oral Given 05/20/23 1515)    ED Course/ Medical Decision Making/ A&P  Medical Decision Making Amount and/or Complexity of Data Reviewed Radiology: ordered.  Risk Prescription drug management.   37 year old male presents with shoulder pain on left side.  Please see HPI for further details.  On examination the patient has limited range of motion of his left shoulder secondary to pain.  There is no obvious deformity, overlying skin change, bruising or erythema.  He has equal grip strength to his upper extremities bilaterally.  He has pain with abduction and tenderness over his trapezius muscle.  There is no centralized cervical spinal tenderness.  He denies any numbness or tingling to his left hand.  Patient plan film imaging is unremarkable.  Patient placed in shoulder sling and will follow-up with orthopedics.  Will provide him with naproxen prescription as well as Flexeril  and advised him to ice at home.  Will also recommend work for 3 days.  Discussed with the patient that if he continues to do repetitive motions at his job this will probably exacerbate his underlying condition.  He voices understanding.  He is stable to discharge.   Final Clinical Impression(s) / ED Diagnoses Final diagnoses:  Chronic left shoulder pain    Rx / DC Orders ED Discharge Orders          Ordered    naproxen (NAPROSYN) 375 MG tablet  2 times daily        05/20/23 1705    cyclobenzaprine (FLEXERIL) 10 MG tablet  2 times daily  PRN        05/20/23 1706              Al Decant, PA-C 05/20/23 1707    Alvira Monday, MD 05/21/23 1044

## 2023-05-23 ENCOUNTER — Emergency Department (HOSPITAL_BASED_OUTPATIENT_CLINIC_OR_DEPARTMENT_OTHER)
Admission: EM | Admit: 2023-05-23 | Discharge: 2023-05-23 | Disposition: A | Discharge status: Home / Self Care | Payer: Self-pay | Attending: Emergency Medicine | Admitting: Emergency Medicine

## 2023-05-23 ENCOUNTER — Encounter (HOSPITAL_BASED_OUTPATIENT_CLINIC_OR_DEPARTMENT_OTHER): Payer: Self-pay | Admitting: Emergency Medicine

## 2023-05-23 ENCOUNTER — Telehealth (HOSPITAL_BASED_OUTPATIENT_CLINIC_OR_DEPARTMENT_OTHER): Payer: Self-pay

## 2023-05-23 ENCOUNTER — Other Ambulatory Visit: Payer: Self-pay

## 2023-05-23 DIAGNOSIS — M25512 Pain in left shoulder: Secondary | ICD-10-CM | POA: Insufficient documentation

## 2023-05-23 DIAGNOSIS — G8929 Other chronic pain: Secondary | ICD-10-CM | POA: Insufficient documentation

## 2023-05-23 DIAGNOSIS — Z0279 Encounter for issue of other medical certificate: Secondary | ICD-10-CM | POA: Insufficient documentation

## 2023-05-23 NOTE — ED Provider Notes (Signed)
[place] [facility]PARTMENT AT [facility] Provider Note   CSN: [id number] Arrival date & time: 05/23/23  1922     History Chief Complaint  Patient presents with   Letter for School/Work    Brian Giustino is a 37 y.o. male with history of chronic left shoulder pain presents emerged from today for work note.  Patient reports that he was seen here a few days prior but needs a work note being specific with weights of how much he can lift or move.  He was given information to follow with orthopedics however patient was unable to do this due to insurance issues and cost.  He reports that his pain is at its baseline however it is improved with the Flexeril and medication given upon discharge with his visit.  He denies any new or worsening symptoms.  HPI     Home Medications Prior to Admission medications   Medication Sig Start Date End Date Taking? Authorizing Provider  acetaminophen (TYLENOL) 500 MG tablet Take 500 mg by mouth daily as needed for mild pain.    [provider]  albuterol (PROVENTIL HFA;VENTOLIN HFA) 108 (90 Base) MCG/ACT inhaler Inhale 2 puffs into the lungs every 4 (four) hours as needed for wheezing or shortness of breath. 12/19/16   Ward, Layla Maw, DO  albuterol (VENTOLIN HFA) 108 (90 Base) MCG/ACT inhaler Inhale 2 puffs into the lungs every 6 (six) hours as needed for wheezing or shortness of breath.    [provider]  cyclobenzaprine (FLEXERIL) 10 MG tablet Take 1 tablet (10 mg total) by mouth 2 (two) times daily as needed for muscle spasms. 05/20/23   Al Decant, PA-C  ibuprofen (ADVIL) 200 MG tablet Take 400 mg by mouth 2 (two) times daily as needed for moderate pain.    [provider]  ibuprofen (ADVIL,MOTRIN) 800 MG tablet Take 1 tablet (800 mg total) by mouth 3 (three) times daily. 12/01/17   Gilda Crease, MD  lidocaine (LIDODERM) 5 % Place 1 patch onto the skin daily. Remove & Discard patch within 12 hours  or as directed by MD 09/29/22   Blue, Soijett A, PA-C  methocarbamol (ROBAXIN) 500 MG tablet Take 1 tablet (500 mg total) by mouth 2 (two) times daily. 09/29/22   Blue, Soijett A, PA-C  naproxen (NAPROSYN) 375 MG tablet Take 1 tablet (375 mg total) by mouth 2 (two) times daily. 05/20/23   Al Decant, PA-C      Allergies    Patient has no known allergies.    Review of Systems   Review of Systems  Constitutional:  Negative for fever.  Musculoskeletal:  Positive for arthralgias.    Physical Exam Updated Vital Signs BP 137/83   Pulse 95   Temp 98.2 F (36.8 C) (Oral)   Resp (!) 22   Ht 5\' 6"  (1.676 m)   Wt 90.7 kg   SpO2 95%   BMI 32.28 kg/m  Physical Exam Vitals and nursing note reviewed.  Constitutional:      General: He is not in acute distress.    Appearance: He is not toxic-appearing.  Cardiovascular:     Rate and Rhythm: Normal rate.  Pulmonary:     Effort: Pulmonary effort is normal. No respiratory distress.  Musculoskeletal:        General: Tenderness present.     Comments: Tenderness to the superior portion of the left shoulder.  No tenderness going into the arm.  He has palpable and  symmetric radial pulses bilaterally.  Equal grip strength bilaterally as well.  Strength with and without resistant is intact and symmetric however does have pain on the left when doing so.  He has no significant swelling.  Coloration and temperature appear and feel symmetric.  Compartments are soft.  Skin:    General: Skin is warm and dry.  Neurological:     Mental Status: He is alert.     ED Results / Procedures / Treatments   Labs (all labs ordered are listed, but only abnormal results are displayed) Labs Reviewed - No data to display  EKG None  Radiology No results found.  Procedures Procedures   Medications Ordered in ED Medications - No data to display  ED Course/ Medical Decision Making/ A&P                                Medical Decision Making   37  y.o. male presents to the ER today for evaluation of needing a work note. The patient reports that his pain in his shoulder is the same but has improved with the flexeril and NSAIDs.   Vital signs unremarkable. Physical exam as noted above.   He is neuro vastly intact distally.  Compartments are soft.  Coloration and temperature appear and feel symmetric bilaterally.  Grip strength is equal.  I had a long discussion with the patient about not being able to give lifting restrictions for work.  Discussed with him that ultimately he will need to follow-up with a orthopedic.  In the meantime, I have listed information for a primary care provider.  Also discussed with him that he can go to occupational health who can provide these limitations as they have the tools to assess this.  I have also placed in a TOC consult order for orthopedics.  Patient reports he was unable to see them due to insurance issues.  Overall, he is reassured and agreeable to the plan for follow-up with the [facility] for reevaluation and possibly with orthopedics.  We discussed plan at bedside. We discussed strict return precautions and red flag symptoms. The patient verbalized their understanding and agrees to the plan. The patient is stable and being discharged home in good condition.  Portions of this report may have been transcribed using voice recognition software. Every effort was made to ensure accuracy; however, inadvertent computerized transcription errors may be present.    Final Clinical Impression(s) / ED Diagnoses Final diagnoses:  Chronic left shoulder pain    Rx / DC Orders ED Discharge Orders     None         Achille Rich, PA-C 05/23/23 2207    Tanda Rockers A, DO 05/24/23 1708

## 2023-05-23 NOTE — Telephone Encounter (Signed)
Pt called requesting a work note that details limitations and lifting restrictions to be able to RTW. Spoke with EDP and will provide not due to not initially evaluating pt. Advised pt to follow up with PCP or ortho. Notified pt

## 2023-05-23 NOTE — ED Triage Notes (Signed)
Pt POV reports recent L shoulder injury. C/o ongoing pain, needs light duty for work.  Unable to f/u with ortho due to cost.

## 2023-05-23 NOTE — Discharge Instructions (Signed)
You were seen in the ER for a work note. I have provided you one for three days. Ultimately, you will need to follow up with the orthopedic provider. I have included the information for a primary care provider for you to follow up with. Please call ASAP to schedule an appointment. Please continue taking your medicaitons as prescribed/needed. If you have any concerns, new or worsening symptoms, please return to the nearest ER for re-evaluation.   Contact a doctor if: Your pain gets worse. Medicine does not help your pain. You have new pain in your arm, hand, or fingers. You loosen your sling and your arm, hand, or fingers: Tingle. Are numb. Are swollen. Get help right away if: Your arm, hand, or fingers turn white or blue.

## 2023-06-04 ENCOUNTER — Other Ambulatory Visit: Payer: Self-pay | Admitting: Nurse Practitioner

## 2023-06-04 ENCOUNTER — Other Ambulatory Visit (HOSPITAL_COMMUNITY): Payer: Self-pay

## 2023-06-04 ENCOUNTER — Ambulatory Visit (INDEPENDENT_AMBULATORY_CARE_PROVIDER_SITE_OTHER): Payer: Self-pay | Admitting: Nurse Practitioner

## 2023-06-04 ENCOUNTER — Encounter: Payer: Self-pay | Admitting: Nurse Practitioner

## 2023-06-04 VITALS — BP 124/76 | HR 85 | Temp 97.6°F | Wt 197.0 lb

## 2023-06-04 DIAGNOSIS — Z130 Encounter for screening for diseases of the blood and blood-forming organs and certain disorders involving the immune mechanism: Secondary | ICD-10-CM

## 2023-06-04 DIAGNOSIS — Z1321 Encounter for screening for nutritional disorder: Secondary | ICD-10-CM

## 2023-06-04 DIAGNOSIS — M25512 Pain in left shoulder: Secondary | ICD-10-CM

## 2023-06-04 DIAGNOSIS — Z0000 Encounter for general adult medical examination without abnormal findings: Secondary | ICD-10-CM | POA: Insufficient documentation

## 2023-06-04 DIAGNOSIS — J45909 Unspecified asthma, uncomplicated: Secondary | ICD-10-CM

## 2023-06-04 DIAGNOSIS — Z13228 Encounter for screening for other metabolic disorders: Secondary | ICD-10-CM

## 2023-06-04 DIAGNOSIS — G8929 Other chronic pain: Secondary | ICD-10-CM

## 2023-06-04 DIAGNOSIS — Z1329 Encounter for screening for other suspected endocrine disorder: Secondary | ICD-10-CM

## 2023-06-04 DIAGNOSIS — F17200 Nicotine dependence, unspecified, uncomplicated: Secondary | ICD-10-CM

## 2023-06-04 DIAGNOSIS — F419 Anxiety disorder, unspecified: Secondary | ICD-10-CM | POA: Insufficient documentation

## 2023-06-04 DIAGNOSIS — R7303 Prediabetes: Secondary | ICD-10-CM

## 2023-06-04 DIAGNOSIS — F1290 Cannabis use, unspecified, uncomplicated: Secondary | ICD-10-CM

## 2023-06-04 HISTORY — DX: Nicotine dependence, unspecified, uncomplicated: F17.200

## 2023-06-04 HISTORY — DX: Other chronic pain: G89.29

## 2023-06-04 LAB — POCT GLYCOSYLATED HEMOGLOBIN (HGB A1C): Hemoglobin A1C: 5.7 % — AB (ref 4.0–5.6)

## 2023-06-04 MED ORDER — ALBUTEROL SULFATE HFA 108 (90 BASE) MCG/ACT IN AERS
2.0000 | INHALATION_SPRAY | Freq: Four times a day (QID) | RESPIRATORY_TRACT | 3 refills | Status: AC | PRN
  Filled 2023-06-04: qty 6.7, 25d supply, fill #0

## 2023-06-04 MED ORDER — ACETAMINOPHEN 500 MG PO TABS
500.0000 mg | ORAL_TABLET | Freq: Four times a day (QID) | ORAL | 2 refills | Status: AC | PRN
  Filled 2023-06-04: qty 30, 8d supply, fill #0

## 2023-06-04 MED ORDER — ACETAMINOPHEN 500 MG PO TABS
500.0000 mg | ORAL_TABLET | Freq: Four times a day (QID) | ORAL | 2 refills | Status: DC | PRN
  Filled 2023-06-04: qty 30, 8d supply, fill #0

## 2023-06-04 MED ORDER — ALBUTEROL SULFATE HFA 108 (90 BASE) MCG/ACT IN AERS
2.0000 | INHALATION_SPRAY | Freq: Four times a day (QID) | RESPIRATORY_TRACT | 3 refills | Status: DC | PRN
  Filled 2023-06-04: qty 1, fill #0

## 2023-06-04 MED ORDER — KETOROLAC TROMETHAMINE 60 MG/2ML IM SOLN
60.0000 mg | Freq: Once | INTRAMUSCULAR | Status: AC
  Administered 2023-06-04: 60 mg via INTRAMUSCULAR

## 2023-06-04 MED ORDER — PREDNISONE 5 MG (21) PO TBPK
ORAL_TABLET | ORAL | 0 refills | Status: DC
Start: 2023-06-04 — End: 2023-07-02

## 2023-06-04 MED ORDER — METHYLPREDNISOLONE NA SUC (PF) 40 MG IJ SOLR
40.0000 mg | Freq: Once | INTRAMUSCULAR | Status: AC
  Administered 2023-06-04: 40 mg via INTRAMUSCULAR

## 2023-06-04 NOTE — Assessment & Plan Note (Signed)
Lab Results  Component Value Date   HGBA1C 5.7 (A) 06/04/2023  Avoid sugar sweets soda

## 2023-06-04 NOTE — Patient Instructions (Addendum)
given Toradol 60 mg injection and Solu-Medrol 40 mg injection in the office today   1. Chronic left shoulder pain - predniSONE (STERAPRED UNI-PAK 21 TAB) 5 MG (21) TBPK tablet; Take as instructed on the packaging  Dispense: 1 each; Refill: 0 - Ambulatory referral to Orthopedic Surgery Please take prednisone with food to help prevent stomach upset.  Take Tylenol 650 mg every 6 hours as needed for pain I encourage application of heat and ice, stretching exercises as tolerated   2. Health care maintenance  - CBC - CMP14+EGFR    It is important that you exercise regularly at least 30 minutes 5 times a week as tolerated  Think about what you will eat, plan ahead. Choose " clean, green, fresh or frozen" over canned, processed or packaged foods which are more sugary, salty and fatty. 70 to 75% of food eaten should be vegetables and fruit. Three meals at set times with snacks allowed between meals, but they must be fruit or vegetables. Aim to eat over a 12 hour period , example 7 am to 7 pm, and STOP after  your last meal of the day. Drink water,generally about 64 ounces per day, no other drink is as healthy. Fruit juice is best enjoyed in a healthy way, by EATING the fruit.  Thanks for choosing [facility] we consider it a privelige to serve you.

## 2023-06-04 NOTE — Assessment & Plan Note (Signed)
Cessation encouraged 

## 2023-06-04 NOTE — Assessment & Plan Note (Addendum)
Smokes about 1 pack/day  Asked about quitting: confirms that he currently smokes cigarettes Advise to quit smoking: Educated about QUITTING to reduce the risk of cancer, cardio and cerebrovascular disease. Assess willingness: Unwilling to quit at this time, not working on cutting back. Assist with counseling and pharmacotherapy: Counseled for 5 minutes and literature provided. Arrange for follow up: follow up in 1 months and continue to offer help.

## 2023-06-04 NOTE — Progress Notes (Addendum)
New Patient Office Visit  Subjective:  Patient ID: Miguel Gallegos, male    DOB: January 28, 1986  Age: 37 y.o. MRN: [id number]  CC:  Chief Complaint  Patient presents with   Hospitalization Follow-up   Establish Care    HPI Miguel Gallegos is a 37 y.o. male  has a past medical history of Anxiety, Asthma, Chronic left shoulder pain (06/04/2023), Seizures (HCC), and Tobacco use disorder (06/04/2023).  Patient presents to establish care for his chronic medical conditions.  Has no previous PCP  Patient was at the emergency room on 05/20/2023 for complaints of chronic left sided sharp pain  shoulder pain.  He has been seen twice in the ED in the past 1 year for this complaint.  He was referred to orthopedics but he did not follow-up due to lack of insurance.  Patient stated that his pain has been worse recently, he has work requires heavy lifting of 10 pounds and above this has been difficult for him, he is requesting a work note with weight lifting restriction.  Stated that he was away from work for 6 days after his recent ED visit.  His pain is currently 10/10.  He has been taking OTC ibuprofen and Flexeril as needed.  Patient denies fever numbness, tingling, Nausea, vomiting, trauma.  X-ray of the shoulder has been unremarkable.  Tobacco use disorder.  Smokes 1 pack of cigarettes daily since his teenage years.  Patient denies cough, shortness of breath, wheezing    Past Medical History:  Diagnosis Date   Anxiety    Asthma    Chronic left shoulder pain 06/04/2023   Seizures (HCC)    last seizure 2005   Tobacco use disorder 06/04/2023    Past Surgical History:  Procedure Laterality Date   EYE SURGERY     I was a child so i can't remember the actual date    History reviewed. No pertinent family history.  Social History   Socioeconomic History   Marital status: Single    Spouse name: Not on file   Number of children: 4   Years of education: Not on file   Highest education level: Not on  file  Occupational History   Not on file  Tobacco Use   Smoking status: Every Day    Current packs/day: 0.50    Average packs/day: 0.5 packs/day for 25.0 years (12.5 ttl pk-yrs)    Types: Cigarettes   Smokeless tobacco: Never  Vaping Use   Vaping status: Never Used  Substance and Sexual Activity   Alcohol use: Yes    Alcohol/week: 1.0 standard drink of alcohol    Types: 1 Cans of beer per week    Comment: Socially   Drug use: Yes    Frequency: 4.0 times per week    Types: Marijuana   Sexual activity: Yes    Birth control/protection: None  Other Topics Concern   Not on file  Social History Narrative   Lives with his mother    Social Determinants of Health   Financial Resource Strain: Not on file  Food Insecurity: Not on file  Transportation Needs: Not on file  Physical Activity: Not on file  Stress: Not on file  Social Connections: Not on file  Intimate Partner Violence: Not on file    ROS Review of Systems  Constitutional:  Negative for appetite change, chills, fatigue and fever.  HENT:  Negative for congestion, postnasal drip, rhinorrhea and sneezing.   Respiratory:  Negative for cough, shortness of breath  and wheezing.   Cardiovascular:  Negative for chest pain, palpitations and leg swelling.  Gastrointestinal:  Negative for abdominal pain, constipation, nausea and vomiting.  Genitourinary:  Negative for difficulty urinating, dysuria, flank pain and frequency.  Musculoskeletal:  Positive for arthralgias. Negative for back pain, joint swelling and myalgias.  Skin:  Negative for color change, pallor, rash and wound.  Neurological:  Negative for dizziness, facial asymmetry, weakness, numbness and headaches.  Psychiatric/Behavioral:  Negative for behavioral problems, confusion, self-injury and suicidal ideas.     Objective:   Today's Vitals: BP 124/76   Pulse 85   Temp 97.6 F (36.4 C)   Wt 197 lb (89.4 kg)   SpO2 98%   BMI 31.80 kg/m   Physical  Exam Vitals and nursing note reviewed.  Constitutional:      General: He is not in acute distress.    Appearance: Normal appearance. He is obese. He is not ill-appearing, toxic-appearing or diaphoretic.  HENT:     Mouth/Throat:     Mouth: Mucous membranes are moist.     Pharynx: Oropharynx is clear. No oropharyngeal exudate or posterior oropharyngeal erythema.  Eyes:     General: No scleral icterus.       Right eye: No discharge.        Left eye: No discharge.     Extraocular Movements: Extraocular movements intact.     Conjunctiva/sclera: Conjunctivae normal.  Cardiovascular:     Rate and Rhythm: Normal rate and regular rhythm.     Pulses: Normal pulses.     Heart sounds: Normal heart sounds. No murmur heard.    No friction rub. No gallop.  Pulmonary:     Effort: Pulmonary effort is normal. No respiratory distress.     Breath sounds: Normal breath sounds. No stridor. No wheezing, rhonchi or rales.  Chest:     Chest wall: No tenderness.  Abdominal:     General: There is no distension.     Palpations: Abdomen is soft.     Tenderness: There is no abdominal tenderness. There is no right CVA tenderness, left CVA tenderness or guarding.  Musculoskeletal:        General: Tenderness present. No swelling, deformity or signs of injury.     Right lower leg: No edema.     Left lower leg: No edema.     Comments: Limited range of motion of left shoulder, reported tenderness on palpation of the left shoulder.  Skin warm and dry has palpable radial pulses He has a sling in place.    Skin:    General: Skin is warm and dry.     Capillary Refill: Capillary refill takes less than 2 seconds.     Coloration: Skin is not jaundiced or pale.     Findings: No bruising, erythema or lesion.  Neurological:     Mental Status: He is alert and oriented to person, place, and time.     Motor: No weakness.     Coordination: Coordination normal.     Gait: Gait normal.  Psychiatric:        Mood and  Affect: Mood normal.        Behavior: Behavior normal.        Thought Content: Thought content normal.        Judgment: Judgment normal.     Assessment & Plan:   Problem List Items Addressed This Visit       Other   Chronic left shoulder pain - Primary  1. Chronic left shoulder pain Encouraged to take Tylenol 650 mg every 6 hours as needed Application of heat, ice stretching exercises encouraged Take prednisone with meals to help prevent GI side effects, patient told not to take prednisone with NSAIDs Work weight lifting restriction of less than 10 pounds giving for 2 weeks Follow-up in the office in 4 weeks.  - predniSONE (STERAPRED UNI-PAK 21 TAB) 5 MG (21) TBPK tablet; Take as instructed on the packaging  Dispense: 1 each; Refill: 0 - Ambulatory referral to Orthopedic Surgery - ketorolac (TORADOL) injection 60 mg - methylPREDNISolone sodium succinate (SOLU-MEDROL) 40 MG injection 40 mg         Relevant Medications   predniSONE (STERAPRED UNI-PAK 21 TAB) 5 MG (21) TBPK tablet   Other Relevant Orders   Ambulatory referral to Orthopedic Surgery   Tobacco use disorder    Smokes about 1 pack/day  Asked about quitting: confirms that he currently smokes cigarettes Advise to quit smoking: Educated about QUITTING to reduce the risk of cancer, cardio and cerebrovascular disease. Assess willingness: Unwilling to quit at this time, not working on cutting back. Assist with counseling and pharmacotherapy: Counseled for 5 minutes and literature provided. Arrange for follow up: follow up in 1 months and continue to offer help.       Prediabetes    Lab Results  Component Value Date   HGBA1C 5.7 (A) 06/04/2023  Avoid sugar sweets soda      Relevant Orders   POCT glycosylated hemoglobin (Hb A1C) (Completed)   Marijuana user    Cessation encouraged      Other Visit Diagnoses     Screening for endocrine, nutritional, metabolic and immunity disorder       Relevant Orders    CBC   CMP14+EGFR       Outpatient Encounter Medications as of 06/04/2023  Medication Sig   acetaminophen (TYLENOL) 500 MG tablet Take 500 mg by mouth daily as needed for mild pain.   albuterol (VENTOLIN HFA) 108 (90 Base) MCG/ACT inhaler Inhale 2 puffs into the lungs every 6 (six) hours as needed for wheezing or shortness of breath.   cyclobenzaprine (FLEXERIL) 10 MG tablet Take 1 tablet (10 mg total) by mouth 2 (two) times daily as needed for muscle spasms.   ibuprofen (ADVIL) 200 MG tablet Take 400 mg by mouth 2 (two) times daily as needed for moderate pain.   predniSONE (STERAPRED UNI-PAK 21 TAB) 5 MG (21) TBPK tablet Take as instructed on the packaging   lidocaine (LIDODERM) 5 % Place 1 patch onto the skin daily. Remove & Discard patch within 12 hours or as directed by MD (Patient not taking: Reported on 06/04/2023)   [DISCONTINUED] albuterol (PROVENTIL HFA;VENTOLIN HFA) 108 (90 Base) MCG/ACT inhaler Inhale 2 puffs into the lungs every 4 (four) hours as needed for wheezing or shortness of breath.   [DISCONTINUED] ibuprofen (ADVIL,MOTRIN) 800 MG tablet Take 1 tablet (800 mg total) by mouth 3 (three) times daily.   [DISCONTINUED] methocarbamol (ROBAXIN) 500 MG tablet Take 1 tablet (500 mg total) by mouth 2 (two) times daily. (Patient not taking: Reported on 06/04/2023)   [DISCONTINUED] naproxen (NAPROSYN) 375 MG tablet Take 1 tablet (375 mg total) by mouth 2 (two) times daily. (Patient not taking: Reported on 06/04/2023)   [EXPIRED] ketorolac (TORADOL) injection 60 mg    [EXPIRED] methylPREDNISolone sodium succinate (SOLU-MEDROL) 40 MG injection 40 mg    No facility-administered encounter medications on file as of 06/04/2023.    Follow-up: Return  in about 4 weeks (around 07/02/2023) for shoulder pain.   Donell Beers, FNP

## 2023-06-04 NOTE — Assessment & Plan Note (Signed)
1. Chronic left shoulder pain Encouraged to take Tylenol 650 mg every 6 hours as needed Application of heat, ice stretching exercises encouraged Take prednisone with meals to help prevent GI side effects, patient told not to take prednisone with NSAIDs Work weight lifting restriction of less than 10 pounds giving for 2 weeks Follow-up in the office in 4 weeks.  - predniSONE (STERAPRED UNI-PAK 21 TAB) 5 MG (21) TBPK tablet; Take as instructed on the packaging  Dispense: 1 each; Refill: 0 - Ambulatory referral to Orthopedic Surgery - ketorolac (TORADOL) injection 60 mg - methylPREDNISolone sodium succinate (SOLU-MEDROL) 40 MG injection 40 mg

## 2023-06-05 ENCOUNTER — Other Ambulatory Visit (HOSPITAL_COMMUNITY): Payer: Self-pay

## 2023-06-05 LAB — CMP14+EGFR
ALT: 55 [IU]/L — ABNORMAL HIGH (ref 0–44)
AST: 27 [IU]/L (ref 0–40)
Albumin: 4.8 g/dL (ref 4.1–5.1)
Alkaline Phosphatase: 83 [IU]/L (ref 44–121)
BUN/Creatinine Ratio: 17 (ref 9–20)
BUN: 13 mg/dL (ref 6–20)
Bilirubin Total: 0.3 mg/dL (ref 0.0–1.2)
CO2: 23 mmol/L (ref 20–29)
Calcium: 9.7 mg/dL (ref 8.7–10.2)
Chloride: 101 mmol/L (ref 96–106)
Creatinine, Ser: 0.78 mg/dL (ref 0.76–1.27)
Globulin, Total: 2.4 g/dL (ref 1.5–4.5)
Glucose: 88 mg/dL (ref 70–99)
Potassium: 4.6 mmol/L (ref 3.5–5.2)
Sodium: 138 mmol/L (ref 134–144)
Total Protein: 7.2 g/dL (ref 6.0–8.5)
eGFR: 118 mL/min/{1.73_m2} (ref >59)

## 2023-06-05 LAB — CBC
Hematocrit: 50.9 % (ref 37.5–51.0)
Hemoglobin: 16.9 g/dL (ref 13.0–17.7)
MCH: 28 pg (ref 26.6–33.0)
MCHC: 33.2 g/dL (ref 31.5–35.7)
MCV: 84 fL (ref 79–97)
Platelets: 242 10*3/uL (ref 150–450)
RBC: 6.04 x10E6/uL — ABNORMAL HIGH (ref 4.14–5.80)
RDW: 12.7 % (ref 11.6–15.4)
WBC: 8.7 10*3/uL (ref 3.4–10.8)

## 2023-06-12 ENCOUNTER — Other Ambulatory Visit (HOSPITAL_COMMUNITY): Payer: Self-pay | Admitting: Nurse Practitioner

## 2023-06-12 ENCOUNTER — Other Ambulatory Visit: Payer: Self-pay

## 2023-06-12 DIAGNOSIS — G8929 Other chronic pain: Secondary | ICD-10-CM

## 2023-06-12 MED ORDER — IBUPROFEN 800 MG PO TABS: 800.0000 mg | ORAL_TABLET | Freq: Three times a day (TID) | ORAL | 0 refills | Status: DC | PRN

## 2023-06-12 NOTE — Telephone Encounter (Signed)
Please advise KH 

## 2023-06-15 ENCOUNTER — Other Ambulatory Visit (HOSPITAL_COMMUNITY): Payer: Self-pay

## 2023-07-02 ENCOUNTER — Ambulatory Visit (INDEPENDENT_AMBULATORY_CARE_PROVIDER_SITE_OTHER): Payer: Self-pay | Admitting: Nurse Practitioner

## 2023-07-02 ENCOUNTER — Encounter: Payer: Self-pay | Admitting: Nurse Practitioner

## 2023-07-02 VITALS — BP 125/76 | HR 87 | Temp 97.9°F | Wt 194.6 lb

## 2023-07-02 DIAGNOSIS — G8929 Other chronic pain: Secondary | ICD-10-CM

## 2023-07-02 DIAGNOSIS — M25512 Pain in left shoulder: Secondary | ICD-10-CM

## 2023-07-02 MED ORDER — LIDOCAINE 5 % EX PTCH: 1.0000 | MEDICATED_PATCH | CUTANEOUS | 0 refills | Status: DC

## 2023-07-02 MED ORDER — KETOROLAC TROMETHAMINE 30 MG/ML IJ SOLN
30.0000 mg | Freq: Once | INTRAMUSCULAR | Status: AC
  Administered 2023-07-02: 30 mg via INTRAMUSCULAR

## 2023-07-02 NOTE — Assessment & Plan Note (Addendum)
Toradol 30 mg injection given in the office today refilled- lidocaine (LIDODERM) 5 %; Place 1 patch onto the skin daily. Remove & Discard patch within 12 hours or as directed by MD  Dispense: 30 patch; Refill: 0 - ketorolac (TORADOL) 30 MG/ML injection 30 mg  Continue ibuprofen 800 mg 3 times daily as needed alternate with Tylenol 500 mg every 6 hours as needed. Continue Flexeril 10 mg twice daily as needed Encouraged to follow-up with orthopedics Application  of heat or ice also encouraged

## 2023-07-02 NOTE — Patient Instructions (Addendum)
You were given Toradol 60 mg injection in the office today.  From tomorrow he can continue ibuprofen 800 mg 3 times daily as needed alternate with Tylenol 650 mg every 6 hours as needed.  1. Chronic left shoulder pain (Primary)  - lidocaine (LIDODERM) 5 %; Place 1 patch onto the skin daily. Remove & Discard patch within 12 hours or as directed by MD  Dispense: 30 patch; Refill: 0    It is important that you exercise regularly at least 30 minutes 5 times a week as tolerated  Think about what you will eat, plan ahead. Choose " clean, green, fresh or frozen" over canned, processed or packaged foods which are more sugary, salty and fatty. 70 to 75% of food eaten should be vegetables and fruit. Three meals at set times with snacks allowed between meals, but they must be fruit or vegetables. Aim to eat over a 12 hour period , example 7 am to 7 pm, and STOP after  your last meal of the day. Drink water,generally about 64 ounces per day, no other drink is as healthy. Fruit juice is best enjoyed in a healthy way, by EATING the fruit.  Thanks for choosing [facility] we consider it a privelige to serve you.

## 2023-07-02 NOTE — Progress Notes (Signed)
Established Patient Office Visit  Subjective:  Patient ID: Miguel Gallegos, male    DOB: 1985/11/02  Age: 37 y.o. MRN: [id number]  CC:  Chief Complaint  Patient presents with   Follow-up    Follow up shoulder pain, not getting any better    HPI Miguel Gallegos is a 37 y.o. male  has a past medical history of Anxiety, Asthma, Chronic left shoulder pain (06/04/2023), Seizures (HCC), and Tobacco use disorder (06/04/2023).  Patient presents for follow-up for his chronic left shoulder pain.  Stated that he has been able to go back to work now on nights daily.  He continues to have throbbing pain rated 10/10 on his left shoulder.  Denies fever or chills numbness tingling.  Takes ibuprofen and Tylenol as needed.  He was referred to orthopedics but he did not follow-up because he is uninsured.  Patient was encouraged to call orthopedics office to schedule an appointment as they could possibly see the patient if he is not able to pay the the full amount needed for him to be seen.    Past Medical History:  Diagnosis Date   Anxiety    Asthma    Chronic left shoulder pain 06/04/2023   Seizures (HCC)    last seizure 2005   Tobacco use disorder 06/04/2023    Past Surgical History:  Procedure Laterality Date   EYE SURGERY     I was a child so i can't remember the actual date    History reviewed. No pertinent family history.  Social History   Socioeconomic History   Marital status: Single    Spouse name: Not on file   Number of children: 4   Years of education: Not on file   Highest education level: Some college, no degree  Occupational History   Not on file  Tobacco Use   Smoking status: Every Day    Current packs/day: 0.50    Average packs/day: 0.5 packs/day for 25.0 years (12.5 ttl pk-yrs)    Types: Cigarettes   Smokeless tobacco: Never  Vaping Use   Vaping status: Never Used  Substance and Sexual Activity   Alcohol use: Yes    Alcohol/week: 1.0 standard drink of alcohol     Types: 1 Cans of beer per week    Comment: Socially   Drug use: Yes    Frequency: 4.0 times per week    Types: Marijuana   Sexual activity: Yes    Birth control/protection: None  Other Topics Concern   Not on file  Social History Narrative   Lives with his mother    Social Drivers of Health   Financial Resource Strain: Low Risk  (06/29/2023)   Overall Financial Resource Strain (CARDIA)    Difficulty of Paying Living Expenses: Not hard at all  Food Insecurity: No Food Insecurity (06/29/2023)   Hunger Vital Sign    Worried About Running Out of Food in the Last Year: Never true    Ran Out of Food in the Last Year: Never true  Transportation Needs: No Transportation Needs (06/29/2023)   PRAPARE - Administrator, Civil Service (Medical): No    Lack of Transportation (Non-Medical): No  Physical Activity: Sufficiently Active (06/29/2023)   Exercise Vital Sign    Days of Exercise per Week: 7 days    Minutes of Exercise per Session: 60 min  Stress: No Stress Concern Present (06/29/2023)   [employer] of Occupational Health - Occupational Stress Questionnaire    Feeling of  Stress : Not at all  Social Connections: Socially Isolated (06/29/2023)   Social Connection and Isolation Panel [NHANES]    Frequency of Communication with Friends and Family: More than three times a week    Frequency of Social Gatherings with Friends and Family: More than three times a week    Attends Religious Services: Never    Database administrator or Organizations: No    Attends Engineer, structural: Not on file    Marital Status: Never married  Intimate Partner Violence: Not on file    Outpatient Medications Prior to Visit  Medication Sig Dispense Refill   acetaminophen (TYLENOL) 500 MG tablet Take 1 tablet (500 mg total) by mouth every 6 (six) hours as needed for mild pain (pain score 1-3). 30 tablet 2   albuterol (VENTOLIN HFA) 108 (90 Base) MCG/ACT inhaler Inhale 2 puffs into  the lungs every 6 (six) hours as needed for wheezing or shortness of breath. 6.7 g 3   ibuprofen (ADVIL) 800 MG tablet Take 1 tablet (800 mg total) by mouth every 8 (eight) hours as needed. 30 tablet 0   cyclobenzaprine (FLEXERIL) 10 MG tablet Take 1 tablet (10 mg total) by mouth 2 (two) times daily as needed for muscle spasms. (Patient not taking: Reported on 07/02/2023) 20 tablet 0   lidocaine (LIDODERM) 5 % Place 1 patch onto the skin daily. Remove & Discard patch within 12 hours or as directed by MD (Patient not taking: Reported on 07/02/2023) 30 patch 0   predniSONE (STERAPRED UNI-PAK 21 TAB) 5 MG (21) TBPK tablet Take as instructed on the packaging (Patient not taking: Reported on 07/02/2023) 1 each 0   No facility-administered medications prior to visit.    No Known Allergies  ROS Review of Systems  Constitutional:  Negative for appetite change, chills, fatigue and fever.  HENT:  Negative for congestion, postnasal drip, rhinorrhea and sneezing.   Respiratory:  Negative for cough, shortness of breath and wheezing.   Cardiovascular:  Negative for chest pain, palpitations and leg swelling.  Gastrointestinal:  Negative for abdominal pain, constipation, nausea and vomiting.  Genitourinary:  Negative for difficulty urinating, dysuria, flank pain and frequency.  Musculoskeletal:  Positive for arthralgias. Negative for back pain, joint swelling and myalgias.  Skin:  Negative for color change, pallor, rash and wound.  Neurological:  Negative for dizziness, facial asymmetry, weakness, numbness and headaches.  Psychiatric/Behavioral:  Negative for behavioral problems, confusion, self-injury and suicidal ideas.       Objective:    Physical Exam Vitals and nursing note reviewed.  Constitutional:      General: He is not in acute distress.    Appearance: Normal appearance. He is obese. He is not ill-appearing, toxic-appearing or diaphoretic.  HENT:     Mouth/Throat:     Mouth: Mucous  membranes are moist.     Pharynx: Oropharynx is clear. No oropharyngeal exudate or posterior oropharyngeal erythema.  Eyes:     General: No scleral icterus.       Right eye: No discharge.        Left eye: No discharge.     Extraocular Movements: Extraocular movements intact.     Conjunctiva/sclera: Conjunctivae normal.  Cardiovascular:     Rate and Rhythm: Normal rate and regular rhythm.     Pulses: Normal pulses.     Heart sounds: Normal heart sounds. No murmur heard.    No friction rub. No gallop.  Pulmonary:     Effort: Pulmonary effort  is normal. No respiratory distress.     Breath sounds: Normal breath sounds. No stridor. No wheezing, rhonchi or rales.  Chest:     Chest wall: No tenderness.  Abdominal:     General: There is no distension.     Palpations: Abdomen is soft.     Tenderness: There is no abdominal tenderness. There is no right CVA tenderness, left CVA tenderness or guarding.  Musculoskeletal:        General: Tenderness present. No swelling, deformity or signs of injury.     Right lower leg: No edema.     Left lower leg: No edema.     Comments: Tenderness on range of motion and palpation of left shoulder, skin warm and dry no redness or swelling noted, has palpable radial pulses  Skin:    General: Skin is warm and dry.     Capillary Refill: Capillary refill takes less than 2 seconds.     Coloration: Skin is not jaundiced or pale.     Findings: No bruising, erythema or lesion.  Neurological:     Mental Status: He is alert and oriented to person, place, and time.     Motor: No weakness.     Coordination: Coordination normal.     Gait: Gait normal.  Psychiatric:        Mood and Affect: Mood normal.        Behavior: Behavior normal.        Thought Content: Thought content normal.        Judgment: Judgment normal.     BP 125/76   Pulse 87   Temp 97.9 F (36.6 C)   Wt 194 lb 9.6 oz (88.3 kg)   SpO2 98%   BMI 31.41 kg/m  Wt Readings from Last 3 Encounters:   07/02/23 194 lb 9.6 oz (88.3 kg)  06/04/23 197 lb (89.4 kg)  05/23/23 200 lb (90.7 kg)    No results found for: "TSH" Lab Results  Component Value Date   WBC 8.7 06/04/2023   HGB 16.9 06/04/2023   HCT 50.9 06/04/2023   MCV 84 06/04/2023   PLT 242 06/04/2023   Lab Results  Component Value Date   NA 138 06/04/2023   K 4.6 06/04/2023   CO2 23 06/04/2023   GLUCOSE 88 06/04/2023   BUN 13 06/04/2023   CREATININE 0.78 06/04/2023   BILITOT 0.3 06/04/2023   ALKPHOS 83 06/04/2023   AST 27 06/04/2023   ALT 55 (H) 06/04/2023   PROT 7.2 06/04/2023   ALBUMIN 4.8 06/04/2023   CALCIUM 9.7 06/04/2023   ANIONGAP 4 (L) 02/21/2022   EGFR 118 06/04/2023   No results found for: "CHOL" No results found for: "HDL" No results found for: "LDLCALC" No results found for: "TRIG" No results found for: "CHOLHDL" Lab Results  Component Value Date   HGBA1C 5.7 (A) 06/04/2023      Assessment & Plan:   Problem List Items Addressed This Visit       Other   Chronic left shoulder pain - Primary   Toradol 30 mg injection given in the office today refilled- lidocaine (LIDODERM) 5 %; Place 1 patch onto the skin daily. Remove & Discard patch within 12 hours or as directed by MD  Dispense: 30 patch; Refill: 0 - ketorolac (TORADOL) 30 MG/ML injection 30 mg  Continue ibuprofen 800 mg 3 times daily as needed alternate with Tylenol 500 mg every 6 hours as needed. Continue Flexeril 10 mg twice daily as needed  Encouraged to follow-up with orthopedics Application  of heat or ice also encouraged      Relevant Medications   lidocaine (LIDODERM) 5 %    Meds ordered this encounter  Medications   lidocaine (LIDODERM) 5 %    Sig: Place 1 patch onto the skin daily. Remove & Discard patch within 12 hours or as directed by MD    Dispense:  30 patch    Refill:  0   ketorolac (TORADOL) 30 MG/ML injection 30 mg    Follow-up: Return in about 6 months (around 12/31/2023).    Donell Beers, FNP

## 2023-07-16 ENCOUNTER — Encounter: Payer: Self-pay | Admitting: Surgical

## 2023-07-16 ENCOUNTER — Ambulatory Visit (INDEPENDENT_AMBULATORY_CARE_PROVIDER_SITE_OTHER): Payer: Self-pay | Admitting: Surgical

## 2023-07-16 ENCOUNTER — Other Ambulatory Visit: Payer: Self-pay

## 2023-07-16 DIAGNOSIS — M25512 Pain in left shoulder: Secondary | ICD-10-CM

## 2023-07-16 MED ORDER — BUPIVACAINE HCL 0.25 % IJ SOLN
0.6600 mL | INTRAMUSCULAR | Status: AC | PRN
  Administered 2023-07-16: .66 mL via INTRA_ARTICULAR

## 2023-07-16 MED ORDER — METHYLPREDNISOLONE ACETATE 40 MG/ML IJ SUSP
13.3300 mg | INTRAMUSCULAR | Status: AC | PRN
  Administered 2023-07-16: 13.33 mg via INTRA_ARTICULAR

## 2023-07-16 MED ORDER — LIDOCAINE HCL 1 % IJ SOLN
3.0000 mL | INTRAMUSCULAR | Status: AC | PRN
  Administered 2023-07-16: 3 mL

## 2023-07-16 NOTE — Progress Notes (Signed)
 Office Visit Note   Patient: Miguel Gallegos           Date of Birth: 01-04-1986           MRN: [id number] Visit Date: 07/16/2023 Requested by: Juanice Thomes SAUNDERS, FNP [id number] S. [address], [address] [place],  [place] [address] PCP: Paseda, Folashade R, FNP  Subjective: Chief Complaint  Patient presents with   Left Shoulder - Pain    HPI: Miguel Gallegos is a 37 y.o. male who presents to the office reporting left shoulder pain.  Patient reports left shoulder pain over the last 2 to 3 months.  He works as a financial risk analyst at [employer].  He was initially seen in March 2024 in the emergency department for acute shoulder pain without any specific incident.  This subsided for a time until it reflared up over the last few months and he was seen again in the emergency room in November 2024.  He then followed up with his primary care provider who referred patient here.  He localizes majority of his pain to the superior aspect of the left shoulder with some radiation to the elbow primarily.  He denies any scapular pain, numbness/tingling, new neck pain, radicular pain.  He does have history of a pinched nerve in the past.  Has never had any surgery on his shoulder or neck.  No history of shoulder dislocation.  He does smoke but has no history of diabetes.  He has tried Tylenol  and Aleve  without much relief.  Did have a course of prednisone  that helped for a time but no improvement long-term with short return of symptoms after he came off the steroids.  He does a lot of overhead lifting on his job.  Pain is primarily worse with overhead lifting..                ROS: All systems reviewed are negative as they relate to the chief complaint within the history of present illness.  Patient denies fevers or chills.  Assessment & Plan: Visit Diagnoses:  1. Arthralgia of left acromioclavicular joint   2. Left shoulder pain, unspecified chronicity     Plan: Patient is a 37 year old male who presents for evaluation of left  shoulder pain.  He has shoulder pain that has been ongoing throughout 2024 with flareup of pain over the last 2 to 3 months.  Most of his pain seems to localize to the [facility] joint on exam today.  He does not have any significant mechanical symptoms that would be concerning for labral pathology though with his positive O'Brien sign and bicipital groove tenderness, this is another possibility.  We discussed options available to patient and he would like to try [facility] joint injection today.  This was administered with out of plane approach under ultrasound today.  Recommended that he send MyChart message in 1 to 2 weeks to let us  know how he is doing.  If no significant improvement, next step would be MRI arthrogram of the left shoulder for further evaluation of SLAP tear.  Follow-Up Instructions: No follow-ups on file.   Orders:  Orders Placed This Encounter  Procedures   US  Guided Needle Placement - No Linked Charges   No orders of the defined types were placed in this encounter.     Procedures: Medium Joint Inj: L acromioclavicular on 07/16/2023 9:05 AM Indications: diagnostic evaluation and pain Details: 25 G 1.5 in needle, ultrasound-guided superior approach Medications: 3 mL lidocaine  1 %; 0.66 mL bupivacaine  0.25 %;  13.33 mg methylPREDNISolone  acetate 40 MG/ML Outcome: tolerated well, no immediate complications Procedure, treatment alternatives, risks and benefits explained, specific risks discussed. Consent was given by the patient. Immediately prior to procedure a time out was called to verify the correct patient, procedure, equipment, support staff and site/side marked as required. Patient was prepped and draped in the usual sterile fashion.       Clinical Data: No additional findings.  Objective: Vital Signs: There were no vitals taken for this visit.  Physical Exam:  Constitutional: Patient appears well-developed HEENT:  Head: Normocephalic Eyes:EOM are normal Neck: Normal range  of motion Cardiovascular: Normal rate Pulmonary/chest: Effort normal Neurologic: Patient is alert Skin: Skin is warm Psychiatric: Patient has normal mood and affect  Ortho Exam: Ortho exam demonstrates right shoulder with 70 degrees X rotation, 120 degrees abduction, 175 degrees forward elevation passively and actively.  This compared with the left shoulder with 70 degrees X rotation, 110 degrees abduction, 170 degrees forward elevation both passively and actively.  Excellent rotator cuff strength of supra, infra, subscap rated 5/5 with no significant reproduction of pain except with supraspinatus strength testing and pain localizing to the top of the shoulder.  Does have moderate to severe tenderness over the [facility] joint of the left shoulder with no tenderness over the right shoulder.  Mild tenderness over the bicipital groove.  Positive O'Brien sign.  Positive Neer sign.  Positive crossarm adduction test.  Axillary nerve intact with deltoid firing.  Intact EPL, FPL, finger abduction, grip strength testing, pronation/supination, bicep, tricep, deltoid.  2+ radial pulse of the left upper extremity.  Specialty Comments:  No specialty comments available.  Imaging: No results found.   PMFS History: Patient Active Problem List   Diagnosis Date Noted   Chronic left shoulder pain 06/04/2023   Tobacco use disorder 06/04/2023   Prediabetes 06/04/2023   Marijuana user 06/04/2023   Anxiety    Past Medical History:  Diagnosis Date   Anxiety    Asthma    Chronic left shoulder pain 06/04/2023   Seizures (HCC)    last seizure 2005   Tobacco use disorder 06/04/2023    No family history on file.  Past Surgical History:  Procedure Laterality Date   EYE SURGERY     I was a child so i can't remember the actual date   Social History   Occupational History   Not on file  Tobacco Use   Smoking status: Every Day    Current packs/day: 0.50    Average packs/day: 0.5 packs/day for 25.0 years (12.5  ttl pk-yrs)    Types: Cigarettes   Smokeless tobacco: Never  Vaping Use   Vaping status: Never Used  Substance and Sexual Activity   Alcohol use: Yes    Alcohol/week: 1.0 standard drink of alcohol    Types: 1 Cans of beer per week    Comment: Socially   Drug use: Yes    Frequency: 4.0 times per week    Types: Marijuana   Sexual activity: Yes    Birth control/protection: None

## 2023-07-19 ENCOUNTER — Other Ambulatory Visit: Payer: Self-pay

## 2023-07-19 DIAGNOSIS — M25512 Pain in left shoulder: Secondary | ICD-10-CM

## 2023-07-19 NOTE — Telephone Encounter (Signed)
 Can we order MRI arthrogram of affected shoulder to rule out SLAP tear and to further evaluate for degree of AC joint arthritis?

## 2023-08-15 ENCOUNTER — Other Ambulatory Visit: Payer: Self-pay

## 2023-08-23 ENCOUNTER — Ambulatory Visit: Payer: Self-pay | Admitting: Surgical

## 2023-08-27 ENCOUNTER — Ambulatory Visit (HOSPITAL_COMMUNITY)
Admission: RE | Admit: 2023-08-27 | Discharge: 2023-08-27 | Discharge status: Home / Self Care | Payer: No Typology Code available for payment source | Source: Ambulatory Visit | Attending: Surgical

## 2023-08-27 ENCOUNTER — Ambulatory Visit (HOSPITAL_COMMUNITY)
Admission: RE | Admit: 2023-08-27 | Discharge: 2023-08-27 | Disposition: A | Discharge status: Home / Self Care | Payer: No Typology Code available for payment source | Source: Ambulatory Visit | Attending: Surgical | Admitting: Surgical

## 2023-08-27 DIAGNOSIS — M25512 Pain in left shoulder: Secondary | ICD-10-CM | POA: Diagnosis present

## 2023-08-27 MED ORDER — GADOBUTROL 1 MMOL/ML IV SOLN
2.0000 mL | Freq: Once | INTRAVENOUS | Status: AC | PRN
  Administered 2023-08-27: 0.5 mL

## 2023-08-27 MED ORDER — IOHEXOL 180 MG/ML  SOLN
10.0000 mL | Freq: Once | INTRAMUSCULAR | Status: AC | PRN
  Administered 2023-08-27: 10 mL

## 2023-08-27 MED ORDER — LIDOCAINE HCL (PF) 1 % IJ SOLN
5.0000 mL | Freq: Once | INTRAMUSCULAR | Status: AC
  Administered 2023-08-27: 5 mL via INTRADERMAL

## 2023-08-27 MED ORDER — SODIUM CHLORIDE (PF) 0.9% IJ SOLUTION - NO CHARGE
10.0000 mL | Freq: Once | INTRAMUSCULAR | Status: DC
  Filled 2023-08-27: qty 10

## 2023-08-27 MED ORDER — SODIUM CHLORIDE (PF) 0.9% IJ SOLUTION - NO CHARGE
10.0000 mL | Freq: Once | INTRAMUSCULAR | Status: AC
  Administered 2023-08-27: 10 mL
  Filled 2023-08-27: qty 10

## 2023-09-01 ENCOUNTER — Other Ambulatory Visit (HOSPITAL_BASED_OUTPATIENT_CLINIC_OR_DEPARTMENT_OTHER): Payer: No Typology Code available for payment source

## 2023-09-04 ENCOUNTER — Ambulatory Visit: Payer: No Typology Code available for payment source | Admitting: Surgical

## 2023-09-18 ENCOUNTER — Ambulatory Visit (INDEPENDENT_AMBULATORY_CARE_PROVIDER_SITE_OTHER): Payer: No Typology Code available for payment source | Admitting: Surgical

## 2023-09-18 DIAGNOSIS — M25512 Pain in left shoulder: Secondary | ICD-10-CM

## 2023-09-18 DIAGNOSIS — M7522 Bicipital tendinitis, left shoulder: Secondary | ICD-10-CM

## 2023-09-22 ENCOUNTER — Encounter: Payer: Self-pay | Admitting: Surgical

## 2023-09-22 NOTE — Progress Notes (Signed)
 Office Visit Note   Patient: Miguel Gallegos           Date of Birth: 05-Feb-1986           MRN: [id number] Visit Date: 09/18/2023 Requested by: Julieanne Cotton, PA-C [address] [place],  [place] [address] PCP: Donell Beers, FNP  Subjective: Chief Complaint  Patient presents with   Left Shoulder - Follow-up    Review MRI scan    HPI: Miguel Gallegos is a 38 y.o. male who presents to the office for MRI review. Patient denies any changes in symptoms.  Continues to complain mainly of superior and anterior left shoulder pain.  He had AC joint injection under ultrasound at his last visit that provided him with about 80% relief for several days before it wore off.  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  MRI results revealed: MR Shoulder Left w/ contrast Result Date: 09/05/2023 CLINICAL DATA:  Left shoulder pain EXAM: MRI OF THE LEFT SHOULDER WITH CONTRAST TECHNIQUE: Multiplanar, multisequence MR imaging of the left shoulder was performed following the administration of intra-articular contrast. CONTRAST:  See Injection Documentation. COMPARISON:  None Available. FINDINGS: Rotator cuff: Mild supraspinatus tendinosis. Infraspinatus tendon is intact. Teres minor tendon is intact. Subscapularis tendon is intact. Muscles: No muscle atrophy or edema. No intramuscular fluid collection or hematoma. Biceps Long Head: Mild tendinosis of the intra-articular portion of the long head of the biceps tendon. Acromioclavicular Joint: No significant arthropathy of the acromioclavicular joint. No subacromial/subdeltoid bursal fluid. Glenohumeral Joint: Intraarticular contrast distending the joint capsule. Normal glenohumeral ligaments. No chondral defect. Labrum: Intact. Bones: No fracture or dislocation. No aggressive osseous lesion. Other: No fluid collection or hematoma. IMPRESSION: 1. Mild supraspinatus tendinosis. 2. Mild tendinosis of the  intra-articular portion of the long head of the biceps tendon. Electronically Signed   By: Elige Ko M.D.   On: 09/05/2023 14:45                 ROS: All systems reviewed are negative as they relate to the chief complaint within the history of present illness.  Patient denies fevers or chills.  Assessment & Plan: Visit Diagnoses:  1. Arthralgia of left acromioclavicular joint   2. Biceps tendinitis of left shoulder     Plan: Miguel Gallegos is a 38 y.o. male who presents to the office for review of left shoulder MRI scan.  MRI does demonstrate some degenerative changes in the [facility] joint with fluid and a little bit of edema in the distal clavicle.  This correlates with his 80% relief from [facility] joint injection at the last visit.  Unfortunately it was short-lived and with his continued severe shoulder pain, he would like to have definitive management of this problem.  We discussed the risks and benefits of surgery including the recovery timeframe.  He would still like to proceed after discussion.  He will be posted for arthroscopic distal clavicle excision and possible bicep tenodesis through a mini open incision based on how the bicep tendon and the superior labrum look at the time of arthroscopy.  No significant abnormalities noted to the bicep tendon on MRI scan but clinically he does have point tenderness over the bicipital groove as well as positive O'Brien sign and pain reproduced with supination strength testing.  Follow-up after procedure.  Follow-Up Instructions: No follow-ups on file.   Orders:  No orders of  the defined types were placed in this encounter.  No orders of the defined types were placed in this encounter.     Procedures: No procedures performed   Clinical Data: No additional findings.  Objective: Vital Signs: There were no vitals taken for this visit.  Physical Exam:  Constitutional: Patient appears well-developed HEENT:  Head: Normocephalic Eyes:EOM are  normal Neck: Normal range of motion Cardiovascular: Normal rate Pulmonary/chest: Effort normal Neurologic: Patient is alert Skin: Skin is warm Psychiatric: Patient has normal mood and affect  Ortho Exam: Ortho exam demonstrates left shoulder with about 65 degrees external rotation, 110 degrees abduction, 175 degrees forward elevation passively and actively.  Excellent rotator cuff strength of supra, infra, subscap.  There is no significant coarse grinding or crepitus noted with passive motion of the shoulder.  Has point tenderness primarily over the [facility] joint on the left shoulder with moderate to severe tenderness here.  He also has mild to moderate tenderness over the bicipital groove with a little bit of pain reproduced with supination strength testing.  There is no Popeye deformity.  He has intact EPL, FPL, finger abduction.  Positive O'Brien sign.  2+ radial pulse of left upper extremity.  Specialty Comments:  No specialty comments available.  Imaging: No results found.   PMFS History: Patient Active Problem List   Diagnosis Date Noted   Chronic left shoulder pain 06/04/2023   Tobacco use disorder 06/04/2023   Prediabetes 06/04/2023   Marijuana user 06/04/2023   Anxiety    Past Medical History:  Diagnosis Date   Anxiety    Asthma    Chronic left shoulder pain 06/04/2023   Seizures (HCC)    last seizure 2005   Tobacco use disorder 06/04/2023    No family history on file.  Past Surgical History:  Procedure Laterality Date   EYE SURGERY     I was a child so i can't remember the actual date   Social History   Occupational History   Not on file  Tobacco Use   Smoking status: Every Day    Current packs/day: 0.50    Average packs/day: 0.5 packs/day for 25.0 years (12.5 ttl pk-yrs)    Types: Cigarettes   Smokeless tobacco: Never  Vaping Use   Vaping status: Never Used  Substance and Sexual Activity   Alcohol use: Yes    Alcohol/week: 1.0 standard drink of alcohol     Types: 1 Cans of beer per week    Comment: Socially   Drug use: Yes    Frequency: 4.0 times per week    Types: Marijuana   Sexual activity: Yes    Birth control/protection: None

## 2023-09-24 NOTE — Telephone Encounter (Signed)
 Does this patient just need to give [place] his new insurance info with Medicaid?

## 2023-09-25 NOTE — Telephone Encounter (Signed)
 Here is the msg we were talking about

## 2023-09-26 ENCOUNTER — Other Ambulatory Visit: Payer: Self-pay

## 2023-09-26 ENCOUNTER — Encounter (HOSPITAL_BASED_OUTPATIENT_CLINIC_OR_DEPARTMENT_OTHER): Payer: Self-pay | Admitting: Emergency Medicine

## 2023-09-26 ENCOUNTER — Emergency Department (HOSPITAL_BASED_OUTPATIENT_CLINIC_OR_DEPARTMENT_OTHER)
Admission: EM | Admit: 2023-09-26 | Discharge: 2023-09-26 | Disposition: A | Discharge status: Home / Self Care | Attending: Emergency Medicine | Admitting: Emergency Medicine

## 2023-09-26 DIAGNOSIS — J069 Acute upper respiratory infection, unspecified: Secondary | ICD-10-CM | POA: Insufficient documentation

## 2023-09-26 DIAGNOSIS — R110 Nausea: Secondary | ICD-10-CM

## 2023-09-26 DIAGNOSIS — R059 Cough, unspecified: Secondary | ICD-10-CM | POA: Diagnosis present

## 2023-09-26 LAB — RESP PANEL BY RT-PCR (RSV, FLU A&B, COVID)  RVPGX2
Influenza A by PCR: NEGATIVE
Influenza B by PCR: NEGATIVE
Resp Syncytial Virus by PCR: NEGATIVE
SARS Coronavirus 2 by RT PCR: NEGATIVE

## 2023-09-26 MED ORDER — ONDANSETRON 4 MG PO TBDP: 4.0000 mg | ORAL_TABLET | Freq: Three times a day (TID) | ORAL | 0 refills | Status: AC | PRN

## 2023-09-26 NOTE — ED Triage Notes (Signed)
 Pt POV c/o cough, symptomatic fever since yesterday.    No known sick contacts.

## 2023-09-26 NOTE — ED Provider Notes (Signed)
 [place] [facility] AT [facility] Provider Note   CSN: [id number] Arrival date & time: 09/26/23  1557     History  Chief Complaint  Patient presents with   Cough    Miguel Gallegos is a 38 y.o. male with past medical history of marijuana use, tobacco use presents to emergency department for evaluation of chills, dry cough, rhinorrhea, nausea that started yesterday.  He has been able to maintain oral hydration.  He denies vomiting, diarrhea, fevers, body aches, known sick contacts.   Cough Associated symptoms: no chest pain, no chills, no fever, no headaches, no shortness of breath and no wheezing       Home Medications Prior to Admission medications   Medication Sig Start Date End Date Taking? Authorizing Provider  ondansetron (ZOFRAN-ODT) 4 MG disintegrating tablet Take 1 tablet (4 mg total) by mouth every 8 (eight) hours as needed for nausea or vomiting. 09/26/23  Yes Judithann Sheen, PA  acetaminophen (TYLENOL) 500 MG tablet Take 1 tablet (500 mg total) by mouth every 6 (six) hours as needed for mild pain (pain score 1-3). 06/04/23   Paseda, Baird Kay, FNP  albuterol (VENTOLIN HFA) 108 (90 Base) MCG/ACT inhaler Inhale 2 puffs into the lungs every 6 (six) hours as needed for wheezing or shortness of breath. 06/04/23   Paseda, Baird Kay, FNP  cyclobenzaprine (FLEXERIL) 10 MG tablet Take 1 tablet (10 mg total) by mouth 2 (two) times daily as needed for muscle spasms. Patient not taking: Reported on 07/02/2023 05/20/23   Al Decant, PA-C  ibuprofen (ADVIL) 800 MG tablet Take 1 tablet (800 mg total) by mouth every 8 (eight) hours as needed. 06/12/23   Paseda, Baird Kay, FNP  lidocaine (LIDODERM) 5 % Place 1 patch onto the skin daily. Remove & Discard patch within 12 hours or as directed by MD 07/02/23   Donell Beers, FNP      Allergies    Patient has no known allergies.    Review of Systems   Review of Systems  Constitutional:  Negative  for chills, fatigue and fever.  Respiratory:  Positive for cough. Negative for chest tightness, shortness of breath and wheezing.   Cardiovascular:  Negative for chest pain and palpitations.  Gastrointestinal:  Positive for nausea. Negative for abdominal pain, constipation, diarrhea and vomiting.  Neurological:  Negative for dizziness, seizures, weakness, light-headedness, numbness and headaches.    Physical Exam Updated Vital Signs BP 127/87   Pulse (!) 118   Temp 98.2 F (36.8 C)   Resp 15   Ht 5\' 6"  (1.676 m)   Wt 83.9 kg   SpO2 98%   BMI 29.86 kg/m  Physical Exam Vitals and nursing note reviewed.  Constitutional:      General: He is not in acute distress.    Appearance: Normal appearance. He is not ill-appearing.  HENT:     Head: Normocephalic and atraumatic.     Right Ear: There is impacted cerumen.     Left Ear: There is impacted cerumen.     Nose: Congestion and rhinorrhea present.     Mouth/Throat:     Mouth: Mucous membranes are moist.     Pharynx: No oropharyngeal exudate or posterior oropharyngeal erythema.  Eyes:     General:        Right eye: No discharge.        Left eye: No discharge.     Conjunctiva/sclera: Conjunctivae normal.  Neck:     Comments:  No meningismus Cardiovascular:     Rate and Rhythm: Tachycardia present.     Comments: Initially tachycardic at 118 bpm however decreased to 98 following rest Pulmonary:     Effort: Pulmonary effort is normal. No respiratory distress.     Breath sounds: Normal breath sounds. No wheezing or rales.  Chest:     Chest wall: No tenderness.  Abdominal:     General: Bowel sounds are normal. There is no distension.     Palpations: Abdomen is soft.     Tenderness: There is no abdominal tenderness. There is no right CVA tenderness, left CVA tenderness, guarding or rebound.  Musculoskeletal:     Cervical back: Normal range of motion and neck supple. No rigidity.     Right lower leg: No edema.     Left lower leg:  No edema.  Skin:    General: Skin is warm.     Capillary Refill: Capillary refill takes less than 2 seconds.     Coloration: Skin is not jaundiced or pale.     Comments: No subconjunctival pallor nor hot to touch skin  Neurological:     Mental Status: He is alert and oriented to person, place, and time. Mental status is at baseline.     ED Results / Procedures / Treatments   Labs (all labs ordered are listed, but only abnormal results are displayed) Labs Reviewed  RESP PANEL BY RT-PCR (RSV, FLU A&B, COVID)  [id number]    EKG None  Radiology No results found.  Procedures Procedures    Medications Ordered in ED Medications - No data to display  ED Course/ Medical Decision Making/ A&P Clinical Course as of 09/26/23 1914  Thu Sep 26, 2023  1906 Pulse Rate(!): 118 Initially tachycardic at 118bpm but following rest, decreased to 98 bpm [LB]  1914 Temp: 98.2 F (36.8 C) Remains afebrile at 98.1 as reassessment [LB]    Clinical Course User Index [LB] Judithann Sheen, PA                                 Medical Decision Making Risk Prescription drug management.   Patient presents to the ED for concern of cough, congestion, nausea, this involves an extensive number of treatment options, and is a complaint that carries with it a high risk of complications and morbidity.  The differential diagnosis includes COVID, flu, RSV, gastroenteritis, pneumonia   Co morbidities that complicate the patient evaluation  None   Additional history obtained:  Additional history obtained from Nursing   External records from outside source obtained and reviewed including triage RN note   Lab Tests:  I Ordered, and personally interpreted labs.  The pertinent results include:   COVID, flu, RSV negative     Medicines ordered and prescription drug management:  I ordered medication including zofran  for nausea  Reevaluation of the patient after these medicines showed that the  patient stayed the same I have reviewed the patients home medicines and have made adjustments as needed     Problem List / ED Course:  Viral URI No alarm symptoms Low suspicion for emergent causes such as PTA, retropharyngeal abscess, Ludwig's angina, pneumonia with reassuring physical exam Overall is mostly well-appearing using cell phone for texting while being examined.  No fever here in emergency department Provided work note Nausea No abdominal pain.  Will provide Zofran prescription for nausea in order to maintain oral hydration  Discussed importance of oral hydration and symptomatic care   Reevaluation:  After the interventions noted above, I reevaluated the patient and found that they have :stayed the same   Social Determinants of Health:  Does not have PCP-gave recommendation   Dispostion:  After consideration of the diagnostic results and the patients response to treatment, I feel that the patent would benefit from outpatient management with symptomatic care.   Discussed ED workup, disposition, return to ED precautions with patient who expresses understanding agrees with plan.  All questions answered to their satisfaction.  They are agreeable to plan.  Discharge instructions provided on paperwork  Final Clinical Impression(s) / ED Diagnoses Final diagnoses:  Viral upper respiratory tract infection  Nausea    Rx / DC Orders ED Discharge Orders          Ordered    ondansetron (ZOFRAN-ODT) 4 MG disintegrating tablet  Every 8 hours PRN        09/26/23 1907              Judithann Sheen, PA 09/26/23 1916    Vanetta Mulders, MD 09/28/23 [phone]

## 2023-09-26 NOTE — Discharge Instructions (Addendum)
 Thank you for letting us evaluate you today.  Your symptoms are consistent with an upper respiratory infection likely caused by a virus.  You are negative for COVID, flu, RSV.  Please make sure to maintain oral hydration.  If you do not eat that is okay as long as you have water, Gatorade, Pedialyte, or chicken broth.  Please make sure to take Tylenol and ibuprofen intermittently every 8 hours as needed for body aches or fever.  I have sent Zofran to your pharmacy to use as needed for nausea and vomiting.  I have also provided you with a primary care provider for routine medical complaints if you need 1  Return to emergency department if you experience difficulty with swallowing liquids, significant worsening of symptoms.

## 2023-09-27 NOTE — Telephone Encounter (Signed)
 Miguel Gallegos, it looks like [name] is approved for Medicaid and now has his insurance information.  Can you reach out to him this afternoon and see if you can get him on the schedule?  Thank you

## 2023-09-30 ENCOUNTER — Telehealth: Payer: Self-pay

## 2023-09-30 NOTE — Transitions of Care (Post Inpatient/ED Visit) (Signed)
   09/30/2023  Name: Miguel Gallegos MRN: [id number] DOB: 12/15/1985  Today's TOC FU Call Status: Today's TOC FU Call Status:: Unsuccessful Call (1st Attempt) Unsuccessful Call (1st Attempt) Date: 09/30/23  Attempted to reach the patient regarding the most recent Inpatient/ED visit.  Follow Up Plan: Additional outreach attempts will be made to reach the patient to complete the Transitions of Care (Post Inpatient/ED visit) call.   Signature Renelda Loma RMA

## 2023-10-01 ENCOUNTER — Telehealth: Payer: Self-pay

## 2023-10-01 NOTE — Transitions of Care (Post Inpatient/ED Visit) (Cosign Needed)
   10/01/2023  Name: Miguel Gallegos MRN: [id number] DOB: 1985-12-15  Today's TOC FU Call Status: Today's TOC FU Call Status:: Successful TOC FU Call Completed TOC FU Call Complete Date: 10/01/23 Patient's Name and Date of Birth confirmed.  Transition Care Management Follow-up Telephone Call Date of Discharge: 09/26/23 Discharge Facility: [facility] Type of Discharge: Emergency Department How have you been since you were released from the hospital?: Better Any questions or concerns?: No  Items Reviewed: Did you receive and understand the discharge instructions provided?: Yes Medications obtained,verified, and reconciled?: Yes (Medications Reviewed) Any new allergies since your discharge?: No Do you have support at home?: Yes  Medications Reviewed Today: Medications Reviewed Today     Reviewed by Renelda Loma, RMA (Registered Medical Assistant) on 10/01/23 at [phone]  Med List Status: <None>   Medication Order Taking? Sig Documenting Provider Last Dose Status Informant  acetaminophen (TYLENOL) 500 MG tablet [id number] Yes Take 1 tablet (500 mg total) by mouth every 6 (six) hours as needed for mild pain (pain score 1-3). Donell Beers, FNP Taking Active   albuterol (VENTOLIN HFA) 108 (90 Base) MCG/ACT inhaler [id number] Yes Inhale 2 puffs into the lungs every 6 (six) hours as needed for wheezing or shortness of breath. Donell Beers, FNP Taking Active   cyclobenzaprine (FLEXERIL) 10 MG tablet [id number] No Take 1 tablet (10 mg total) by mouth 2 (two) times daily as needed for muscle spasms.  Patient not taking: Reported on 10/01/2023   Al Decant, PA-C Not Taking Active   ibuprofen (ADVIL) 800 MG tablet [id number] Yes Take 1 tablet (800 mg total) by mouth every 8 (eight) hours as needed. Donell Beers, FNP Taking Active   lidocaine (LIDODERM) 5 % [id number] Yes Place 1 patch onto the skin daily. Remove & Discard patch within 12 hours or as directed by MD  Donell Beers, FNP Taking Active   ondansetron (ZOFRAN-ODT) 4 MG disintegrating tablet [id number] Yes Take 1 tablet (4 mg total) by mouth every 8 (eight) hours as needed for nausea or vomiting. Judithann Sheen, PA Taking Active             Home Care and Equipment/Supplies: Were Home Health Services Ordered?: NA Any new equipment or medical supplies ordered?: NA  Functional Questionnaire: Do you need assistance with bathing/showering or dressing?: No Do you need assistance with meal preparation?: No Do you need assistance with eating?: No Do you have difficulty maintaining continence: No Do you need assistance with getting out of bed/getting out of a chair/moving?: No Do you have difficulty managing or taking your medications?: No  Follow up appointments reviewed: PCP Follow-up appointment confirmed?: Yes Date of PCP follow-up appointment?: 10/11/23 Follow-up Provider: folashade paseda Specialist [facility] Follow-up appointment confirmed?: NA Do you need transportation to your follow-up appointment?: No  SDOH Interventions Today    Flowsheet Row Most Recent Value  SDOH Interventions   Food Insecurity Interventions Intervention Not Indicated  Housing Interventions Intervention Not Indicated  Transportation Interventions Intervention Not Indicated  Utilities Interventions Intervention Not Indicated       SIGNATURE  .kh

## 2023-10-04 NOTE — Progress Notes (Signed)
 Surgical Instructions   Your procedure is scheduled on Tuesday, March 25th, 2025. Report to [facility] Main Entrance "A" at 8:30 A.M., then check in with the Admitting office. Any questions or running late day of surgery: call [phone]  Questions prior to your surgery date: call [phone], Monday-Friday, 8am-4pm. If you experience any cold or flu symptoms such as cough, fever, chills, shortness of breath, etc. between now and your scheduled surgery, please notify us at the above number.     Remember:  Do not eat after midnight the night before your surgery   You may drink clear liquids until 7:30 the morning of your surgery.   Clear liquids allowed are: Water, Non-Citrus Juices (without pulp), Carbonated Beverages, Clear Tea (no milk, honey, etc.), Black Coffee Only (NO MILK, CREAM OR POWDERED CREAMER of any kind), and Gatorade.    Take these medicines the morning of surgery with A SIP OF WATER: None.   May take these medicines IF NEEDED: Acetaminophen (Tylenol) Albuterol Inhaler - please bring with you on the day of surgery Ondansetron (Zofran)    One week prior to surgery, STOP taking any Aspirin (unless otherwise instructed by your surgeon) Aleve, Naproxen, Ibuprofen, Motrin, Advil, Goody's, BC's, all herbal medications, fish oil, and non-prescription vitamins.                     Do NOT Smoke (Tobacco/Vaping) for 24 hours prior to your procedure.  If you use a CPAP at night, you may bring your mask/headgear for your overnight stay.   You will be asked to remove any contacts, glasses, piercing's, hearing aid's, dentures/partials prior to surgery. Please bring cases for these items if needed.    Patients discharged the day of surgery will not be allowed to drive home, and someone needs to stay with them for 24 hours.  SURGICAL WAITING ROOM VISITATION Patients may have no more than 2 support people in the waiting area - these visitors may rotate.   Pre-op nurse will  coordinate an appropriate time for 1 ADULT support person, who may not rotate, to accompany patient in pre-op.  Children under the age of 57 must have an adult with them who is not the patient and must remain in the main waiting area with an adult.  If the patient needs to stay at the hospital during part of their recovery, the visitor guidelines for inpatient rooms apply.  Please refer to the [facility] website for the visitor guidelines for any additional information.   If you received a COVID test during your pre-op visit  it is requested that you wear a mask when out in public, stay away from anyone that may not be feeling well and notify your surgeon if you develop symptoms. If you have been in contact with anyone that has tested positive in the last 10 days please notify you surgeon.      Please follow these instructions carefully:  BENZOYL PEROXIDE 5% GEL  Please do not use if you have an allergy to benzoyl peroxide. If your skin becomes reddened/irritated stop using the benzoyl peroxide.  Starting two days before surgery, apply as follows:  1. Apply benzoyl peroxide in the morning and at night. Apply after taking a shower. If you are not taking a shower clean entire shoulder front, back, and side along with the armpit with a clean wet washcloth.  2. Place a quarter-sized dollop on your SHOULDER and rub in thoroughly, making sure to cover the front,  back, and side of your shoulder, along with the armpit.   2 Days prior to Surgery First Dose on _____________ Morning Second Dose on ______________ Night  Day Before Surgery First Dose on ______________ Morning Night before surgery wash (entire body except face and private areas) with CHG Soap THEN Second Dose on ____________ Night   Morning of Surgery  wash BODY AGAIN with CHG Soap   4. Do NOT apply benzoyl peroxide gel on the day of surgery           Pre-operative CHG Bathing Instructions   You can play a key  role in reducing the risk of infection after surgery. Your skin needs to be as free of germs as possible. You can reduce the number of germs on your skin by washing with CHG (chlorhexidine gluconate) soap before surgery. CHG is an antiseptic soap that kills germs and continues to kill germs even after washing.   DO NOT use if you have an allergy to chlorhexidine/CHG or antibacterial soaps. If your skin becomes reddened or irritated, stop using the CHG and notify one of our RNs at [phone].              TAKE A SHOWER THE NIGHT BEFORE SURGERY AND THE DAY OF SURGERY    Please keep in mind the following:  DO NOT shave, including legs and underarms, 48 hours prior to surgery.   You may shave your face before/day of surgery.  Place clean sheets on your bed the night before surgery Use a clean washcloth (not used since being washed) for each shower. DO NOT sleep with pet's night before surgery.  CHG Shower Instructions:  Wash your face and private area with normal soap. If you choose to wash your hair, wash first with your normal shampoo.  After you use shampoo/soap, rinse your hair and body thoroughly to remove shampoo/soap residue.  Turn the water OFF and apply half the bottle of CHG soap to a CLEAN washcloth.  Apply CHG soap ONLY FROM YOUR NECK DOWN TO YOUR TOES (washing for 3-5 minutes)  DO NOT use CHG soap on face, private areas, open wounds, or sores.  Pay special attention to the area where your surgery is being performed.  If you are having back surgery, having someone wash your back for you may be helpful. Wait 2 minutes after CHG soap is applied, then you may rinse off the CHG soap.  Pat dry with a clean towel  Put on clean pajamas    Additional instructions for the day of surgery: DO NOT APPLY any lotions, deodorants, cologne, or perfumes.   Do not wear jewelry or makeup Do not wear nail polish, gel polish, artificial nails, or any other type of covering on natural nails (fingers  and toes) Do not bring valuables to the hospital. [facility] is not responsible for valuables/personal belongings. Put on clean/comfortable clothes.  Please brush your teeth.  Ask your nurse before applying any prescription medications to the skin.

## 2023-10-07 ENCOUNTER — Encounter (HOSPITAL_COMMUNITY): Payer: Self-pay

## 2023-10-07 ENCOUNTER — Encounter (HOSPITAL_COMMUNITY)
Admission: RE | Admit: 2023-10-07 | Discharge: 2023-10-07 | Disposition: A | Discharge status: Home / Self Care | Source: Ambulatory Visit | Attending: Orthopedic Surgery | Admitting: Orthopedic Surgery

## 2023-10-07 ENCOUNTER — Telehealth: Payer: Self-pay | Admitting: Orthopedic Surgery

## 2023-10-07 DIAGNOSIS — Z01812 Encounter for preprocedural laboratory examination: Secondary | ICD-10-CM | POA: Insufficient documentation

## 2023-10-07 DIAGNOSIS — Z01818 Encounter for other preprocedural examination: Secondary | ICD-10-CM

## 2023-10-07 LAB — BASIC METABOLIC PANEL
Anion gap: 8 (ref 5–15)
BUN: 12 mg/dL (ref 6–20)
CO2: 25 mmol/L (ref 22–32)
Calcium: 9.8 mg/dL (ref 8.9–10.3)
Chloride: 104 mmol/L (ref 98–111)
Creatinine, Ser: 0.82 mg/dL (ref 0.61–1.24)
GFR, Estimated: >60 mL/min (ref >60)
Glucose, Bld: 79 mg/dL (ref 70–99)
Potassium: 4.4 mmol/L (ref 3.5–5.1)
Sodium: 137 mmol/L (ref 135–145)

## 2023-10-07 LAB — CBC
HCT: 50.9 % (ref 39.0–52.0)
Hemoglobin: 16.9 g/dL (ref 13.0–17.0)
MCH: 27.8 pg (ref 26.0–34.0)
MCHC: 33.2 g/dL (ref 30.0–36.0)
MCV: 83.9 fL (ref 80.0–100.0)
Platelets: 275 10*3/uL (ref 150–400)
RBC: 6.07 MIL/uL — ABNORMAL HIGH (ref 4.22–5.81)
RDW: 12.8 % (ref 11.5–15.5)
WBC: 8.8 10*3/uL (ref 4.0–10.5)
nRBC: 0 % (ref 0.0–0.2)

## 2023-10-07 NOTE — Telephone Encounter (Signed)
 Miguel Gallegos, can you fax a doctor's note to this number and attention to basically say "out of work 2-3 months following shoulder surgery"

## 2023-10-07 NOTE — Telephone Encounter (Signed)
Ok for note as requested.

## 2023-10-07 NOTE — Progress Notes (Signed)
 PCP - Dr. Edwin Dada Cardiologist - Denies  PPM/ICD - Denies Device Orders - N/A Rep Notified - N/A  Chest x-ray - N/A EKG - N/A Stress Test - Denies ECHO - Denies Cardiac Cath - Denies  Sleep Study - Denies CPAP - N/A  NON-diabetic  Last dose of GLP1 agonist-  Denies GLP1 instructions: N/A  Blood Thinner Instructions: Denies Aspirin Instructions: Denies  ERAS Protcol - Clears until 7:30 PRE-SURGERY Ensure or G2- Ensure  COVID TEST- N/A   Anesthesia review: N   Patient denies shortness of breath, fever, cough and chest pain at PAT appointment. Patient was in the ED on 09/26/23 for nausea and cough. Patient states he is no longer having those issues.  Patient denies any respiratory issues at this time.    All instructions explained to the patient, with a verbal understanding of the material. Patient agrees to go over the instructions while at home for a better understanding. Patient also instructed to self quarantine after being tested for COVID-19. The opportunity to ask questions was provided.

## 2023-10-07 NOTE — Telephone Encounter (Signed)
 Okay for this.

## 2023-10-07 NOTE — Telephone Encounter (Signed)
 Patient states he needs a note stating he is having shoulder surgery on 10/08/23 and that he will need to remain out of work 6-8 weeks . Fax to employer at [employer], Attn: Fanny Bien [phone]

## 2023-10-08 ENCOUNTER — Encounter (HOSPITAL_BASED_OUTPATIENT_CLINIC_OR_DEPARTMENT_OTHER): Payer: Self-pay

## 2023-10-08 ENCOUNTER — Other Ambulatory Visit: Payer: Self-pay

## 2023-10-08 ENCOUNTER — Ambulatory Visit (HOSPITAL_COMMUNITY): Admitting: Anesthesiology

## 2023-10-08 ENCOUNTER — Ambulatory Visit (HOSPITAL_COMMUNITY)
Admission: RE | Admit: 2023-10-08 | Discharge: 2023-10-08 | Disposition: A | Discharge status: Home / Self Care | Source: Ambulatory Visit | Attending: Orthopedic Surgery | Admitting: Orthopedic Surgery

## 2023-10-08 ENCOUNTER — Encounter (HOSPITAL_COMMUNITY)
Admission: RE | Disposition: A | Discharge status: Home / Self Care | Payer: Self-pay | Source: Ambulatory Visit | Attending: Orthopedic Surgery

## 2023-10-08 ENCOUNTER — Encounter (HOSPITAL_COMMUNITY): Payer: Self-pay | Admitting: Orthopedic Surgery

## 2023-10-08 DIAGNOSIS — Z01818 Encounter for other preprocedural examination: Secondary | ICD-10-CM

## 2023-10-08 DIAGNOSIS — M7542 Impingement syndrome of left shoulder: Secondary | ICD-10-CM | POA: Diagnosis not present

## 2023-10-08 DIAGNOSIS — J45909 Unspecified asthma, uncomplicated: Secondary | ICD-10-CM | POA: Diagnosis not present

## 2023-10-08 DIAGNOSIS — M7552 Bursitis of left shoulder: Secondary | ICD-10-CM

## 2023-10-08 DIAGNOSIS — M7522 Bicipital tendinitis, left shoulder: Secondary | ICD-10-CM

## 2023-10-08 DIAGNOSIS — M19012 Primary osteoarthritis, left shoulder: Secondary | ICD-10-CM

## 2023-10-08 DIAGNOSIS — F17210 Nicotine dependence, cigarettes, uncomplicated: Secondary | ICD-10-CM | POA: Insufficient documentation

## 2023-10-08 HISTORY — PX: POSTERIOR LUMBAR FUSION 2 WITH HARDWARE REMOVAL: SHX7297

## 2023-10-08 SURGERY — ARTHROSCOPY, SHOULDER WITH DEBRIDEMENT
Anesthesia: General | Laterality: Left

## 2023-10-08 MED ORDER — ONDANSETRON HCL 4 MG/2ML IJ SOLN
INTRAMUSCULAR | Status: AC
  Filled 2023-10-08: qty 2

## 2023-10-08 MED ORDER — LIDOCAINE 2% (20 MG/ML) 5 ML SYRINGE
INTRAMUSCULAR | Status: DC | PRN
Start: 2023-10-08 — End: 2023-10-08
  Administered 2023-10-08: 40 mg via INTRAVENOUS

## 2023-10-08 MED ORDER — OXYCODONE HCL 5 MG PO TABS: 5.0000 mg | ORAL_TABLET | ORAL | 0 refills | Status: DC | PRN

## 2023-10-08 MED ORDER — SODIUM CHLORIDE (PF) 0.9 % IJ SOLN
INTRAMUSCULAR | Status: DC | PRN
  Administered 2023-10-08: 15 mL

## 2023-10-08 MED ORDER — LACTATED RINGERS IV SOLN: INTRAVENOUS | Status: DC

## 2023-10-08 MED ORDER — OXYCODONE HCL 5 MG PO TABS: 5.0000 mg | ORAL_TABLET | Freq: Once | ORAL | Status: DC | PRN

## 2023-10-08 MED ORDER — MEPERIDINE HCL 25 MG/ML IJ SOLN: 6.2500 mg | INTRAMUSCULAR | Status: DC | PRN

## 2023-10-08 MED ORDER — ROCURONIUM BROMIDE 10 MG/ML (PF) SYRINGE
PREFILLED_SYRINGE | INTRAVENOUS | Status: DC | PRN
Start: 2023-10-08 — End: 2023-10-08
  Administered 2023-10-08 (×2): 50 mg via INTRAVENOUS

## 2023-10-08 MED ORDER — METHOCARBAMOL 500 MG PO TABS: 500.0000 mg | ORAL_TABLET | Freq: Three times a day (TID) | ORAL | 1 refills | Status: AC | PRN

## 2023-10-08 MED ORDER — BUPIVACAINE LIPOSOME 1.3 % IJ SUSP
INTRAMUSCULAR | Status: DC | PRN
Start: 2023-10-08 — End: 2023-10-08
  Administered 2023-10-08: 10 mL via PERINEURAL

## 2023-10-08 MED ORDER — FENTANYL CITRATE (PF) 100 MCG/2ML IJ SOLN
INTRAMUSCULAR | Status: DC | PRN
  Administered 2023-10-08: 50 ug via INTRAVENOUS

## 2023-10-08 MED ORDER — HYDROMORPHONE HCL 1 MG/ML IJ SOLN: 0.2500 mg | INTRAMUSCULAR | Status: DC | PRN

## 2023-10-08 MED ORDER — MIDAZOLAM HCL 2 MG/2ML IJ SOLN
INTRAMUSCULAR | Status: AC
Start: 2023-10-08 — End: 2023-10-08
  Administered 2023-10-08: 2 mg via INTRAVENOUS
  Filled 2023-10-08: qty 2

## 2023-10-08 MED ORDER — EPINEPHRINE PF 1 MG/ML IJ SOLN
INTRAMUSCULAR | Status: AC
  Filled 2023-10-08: qty 3

## 2023-10-08 MED ORDER — FENTANYL CITRATE (PF) 100 MCG/2ML IJ SOLN
INTRAMUSCULAR | Status: AC
  Filled 2023-10-08: qty 2

## 2023-10-08 MED ORDER — ORAL CARE MOUTH RINSE: 15.0000 mL | Freq: Once | OROMUCOSAL | Status: AC

## 2023-10-08 MED ORDER — ROCURONIUM BROMIDE 10 MG/ML (PF) SYRINGE
PREFILLED_SYRINGE | INTRAVENOUS | Status: AC
  Filled 2023-10-08: qty 10

## 2023-10-08 MED ORDER — SODIUM CHLORIDE 0.9 % IR SOLN
Status: DC | PRN
  Administered 2023-10-08: 6000 mL
  Administered 2023-10-08: 1000 mL

## 2023-10-08 MED ORDER — BUPIVACAINE HCL (PF) 0.5 % IJ SOLN
INTRAMUSCULAR | Status: DC | PRN
  Administered 2023-10-08: 20 mL via PERINEURAL

## 2023-10-08 MED ORDER — PROPOFOL 10 MG/ML IV BOLUS
INTRAVENOUS | Status: DC | PRN
  Administered 2023-10-08: 150 mg via INTRAVENOUS

## 2023-10-08 MED ORDER — SODIUM CHLORIDE 0.9 % IV SOLN: 12.5000 mg | INTRAVENOUS | Status: DC | PRN

## 2023-10-08 MED ORDER — SUGAMMADEX SODIUM 200 MG/2ML IV SOLN
INTRAVENOUS | Status: DC | PRN
  Administered 2023-10-08: 200 mg via INTRAVENOUS

## 2023-10-08 MED ORDER — CEFAZOLIN SODIUM-DEXTROSE 2-4 GM/100ML-% IV SOLN
2.0000 g | INTRAVENOUS | Status: AC
  Administered 2023-10-08: 2 g via INTRAVENOUS
  Filled 2023-10-08: qty 100

## 2023-10-08 MED ORDER — EPINEPHRINE (ANAPHYLAXIS) 30 MG/30ML IJ SOLN
INTRAMUSCULAR | Status: DC | PRN
  Administered 2023-10-08: .15 mg via INTRAMUSCULAR

## 2023-10-08 MED ORDER — EPINEPHRINE PF 1 MG/ML IJ SOLN
INTRAMUSCULAR | Status: DC | PRN
  Administered 2023-10-08 (×2): 1 mL

## 2023-10-08 MED ORDER — CHLORHEXIDINE GLUCONATE 0.12 % MT SOLN
OROMUCOSAL | Status: AC
  Administered 2023-10-08: 15 mL via OROMUCOSAL
  Filled 2023-10-08: qty 15

## 2023-10-08 MED ORDER — DEXAMETHASONE SODIUM PHOSPHATE 10 MG/ML IJ SOLN
INTRAMUSCULAR | Status: AC
  Filled 2023-10-08: qty 1

## 2023-10-08 MED ORDER — LIDOCAINE 2% (20 MG/ML) 5 ML SYRINGE
INTRAMUSCULAR | Status: AC
Start: 2023-10-08 — End: ?
  Filled 2023-10-08: qty 5

## 2023-10-08 MED ORDER — CHLORHEXIDINE GLUCONATE 0.12 % MT SOLN: 15.0000 mL | Freq: Once | OROMUCOSAL | Status: AC

## 2023-10-08 MED ORDER — FENTANYL CITRATE (PF) 100 MCG/2ML IJ SOLN: 100.0000 ug | Freq: Once | INTRAMUSCULAR | Status: AC

## 2023-10-08 MED ORDER — ONDANSETRON HCL 4 MG/2ML IJ SOLN
INTRAMUSCULAR | Status: DC | PRN
  Administered 2023-10-08: 4 mg via INTRAVENOUS

## 2023-10-08 MED ORDER — DEXAMETHASONE SODIUM PHOSPHATE 10 MG/ML IJ SOLN
INTRAMUSCULAR | Status: DC | PRN
  Administered 2023-10-08: 10 mg via INTRAVENOUS

## 2023-10-08 MED ORDER — OXYCODONE HCL 5 MG/5ML PO SOLN: 5.0000 mg | Freq: Once | ORAL | Status: DC | PRN

## 2023-10-08 MED ORDER — AMISULPRIDE (ANTIEMETIC) 5 MG/2ML IV SOLN: 10.0000 mg | Freq: Once | INTRAVENOUS | Status: DC | PRN

## 2023-10-08 MED ORDER — FENTANYL CITRATE (PF) 100 MCG/2ML IJ SOLN
INTRAMUSCULAR | Status: AC
  Administered 2023-10-08: 100 ug via INTRAVENOUS
  Filled 2023-10-08: qty 2

## 2023-10-08 MED ORDER — MIDAZOLAM HCL 2 MG/2ML IJ SOLN: 2.0000 mg | Freq: Once | INTRAMUSCULAR | Status: AC

## 2023-10-08 SURGICAL SUPPLY — 62 items
ALCOHOL 70% 16 OZ (MISCELLANEOUS) ×1 IMPLANT
BAG COUNTER SPONGE SURGICOUNT (BAG) ×1 IMPLANT
BLADE CUTTER GATOR 3.5 (BLADE) IMPLANT
BLADE EXCALIBUR 4.0X13 (MISCELLANEOUS) IMPLANT
BLADE SURG 11 STRL SS (BLADE) IMPLANT
BUR OVAL 6.0 (BURR) IMPLANT
COVER SURGICAL LIGHT HANDLE (MISCELLANEOUS) ×1 IMPLANT
DRAPE INCISE IOBAN 66X45 STRL (DRAPES) ×2 IMPLANT
DRAPE STERI 35X30 U-POUCH (DRAPES) ×1 IMPLANT
DRAPE U-SHAPE 47X51 STRL (DRAPES) ×2 IMPLANT
DRSG CURAD 3X16 NADH (PACKING) IMPLANT
DRSG IV TEGADERM 3.5X4.5 STRL (GAUZE/BANDAGES/DRESSINGS) IMPLANT
DRSG TEGADERM 4X4.75 (GAUZE/BANDAGES/DRESSINGS) ×3 IMPLANT
DURAPREP 26ML APPLICATOR (WOUND CARE) ×1 IMPLANT
ELECT REM PT RETURN 9FT ADLT (ELECTROSURGICAL) ×1 IMPLANT
ELECTRODE REM PT RTRN 9FT ADLT (ELECTROSURGICAL) ×1 IMPLANT
FILTER STRAW FLUID ASPIR (MISCELLANEOUS) ×1 IMPLANT
GAUZE SPONGE 4X4 12PLY STRL (GAUZE/BANDAGES/DRESSINGS) ×1 IMPLANT
GAUZE XEROFORM 1X8 LF (GAUZE/BANDAGES/DRESSINGS) ×1 IMPLANT
GLOVE BIOGEL PI IND STRL 7.0 (GLOVE) ×1 IMPLANT
GLOVE BIOGEL PI IND STRL 8 (GLOVE) ×1 IMPLANT
GLOVE ECLIPSE 7.0 STRL STRAW (GLOVE) ×1 IMPLANT
GLOVE ECLIPSE 8.0 STRL XLNG CF (GLOVE) ×1 IMPLANT
GOWN STRL REUS W/ TWL LRG LVL3 (GOWN DISPOSABLE) ×3 IMPLANT
HYDROGEN PEROXIDE 16OZ (MISCELLANEOUS) ×1 IMPLANT
KIT BASIN OR (CUSTOM PROCEDURE TRAY) ×1 IMPLANT
KIT TURNOVER KIT B (KITS) ×1 IMPLANT
MANIFOLD NEPTUNE II (INSTRUMENTS) ×1 IMPLANT
NDL HYPO 25X1 1.5 SAFETY (NEEDLE) ×1 IMPLANT
NDL SCORPION MULTI FIRE (NEEDLE) IMPLANT
NDL SPNL 18GX3.5 QUINCKE PK (NEEDLE) ×1 IMPLANT
NDL SUT 6 .5 CRC .975X.05 MAYO (NEEDLE) ×1 IMPLANT
NEEDLE HYPO 25X1 1.5 SAFETY (NEEDLE) ×1 IMPLANT
NEEDLE SCORPION MULTI FIRE (NEEDLE) IMPLANT
NEEDLE SPNL 18GX3.5 QUINCKE PK (NEEDLE) ×1 IMPLANT
NS IRRIG 1000ML POUR BTL (IV SOLUTION) ×1 IMPLANT
PACK SHOULDER (CUSTOM PROCEDURE TRAY) ×1 IMPLANT
PAD ARMBOARD POSITIONER FOAM (MISCELLANEOUS) ×2 IMPLANT
PORT APPOLLO RF 90DEGREE MULTI (SURGICAL WAND) IMPLANT
RESTRAINT HEAD UNIVERSAL NS (MISCELLANEOUS) ×1 IMPLANT
SLING ARM IMMOBILIZER LRG (SOFTGOODS) IMPLANT
SOL PREP POV-IOD 4OZ 10% (MISCELLANEOUS) ×2 IMPLANT
SPONGE T-LAP 4X18 ~~LOC~~+RFID (SPONGE) ×1 IMPLANT
STRIP CLOSURE SKIN 1/2X4 (GAUZE/BANDAGES/DRESSINGS) ×1 IMPLANT
SUCTION TUBE FRAZIER 10FR DISP (SUCTIONS) ×1 IMPLANT
SUT 2 FIBERLOOP 20 STRT BLUE (SUTURE) IMPLANT
SUT ETHILON 3 0 PS 1 (SUTURE) ×2 IMPLANT
SUT FIBERWIRE #2 38 T-5 BLUE (SUTURE) IMPLANT
SUT MNCRL AB 3-0 PS2 18 (SUTURE) ×1 IMPLANT
SUT VIC AB 0 CT1 27XBRD ANBCTR (SUTURE) ×2 IMPLANT
SUT VIC AB 1 CT1 27XBRD ANBCTR (SUTURE) ×1 IMPLANT
SUT VIC AB 2-0 CT2 27 (SUTURE) ×1 IMPLANT
SUT VICRYL 0 UR6 27IN ABS (SUTURE) ×1 IMPLANT
SUTURE 2 FIBERLOOP 20 STRT BLU (SUTURE) IMPLANT
SUTURE FIBERWR #2 38 T-5 BLUE (SUTURE) IMPLANT
SYR 20ML LL LF (SYRINGE) ×2 IMPLANT
SYR 30ML LL (SYRINGE) ×1 IMPLANT
SYR TB 1ML LUER SLIP (SYRINGE) ×1 IMPLANT
TOWEL GREEN STERILE (TOWEL DISPOSABLE) ×1 IMPLANT
TOWEL GREEN STERILE FF (TOWEL DISPOSABLE) ×1 IMPLANT
TUBING ARTHROSCOPY IRRIG 16FT (MISCELLANEOUS) ×1 IMPLANT
WATER STERILE IRR 1000ML POUR (IV SOLUTION) ×1 IMPLANT

## 2023-10-08 NOTE — Anesthesia Preprocedure Evaluation (Signed)
 Anesthesia Evaluation  Patient identified by MRN, date of birth, ID band Patient awake    Reviewed: Allergy & Precautions, H&P , NPO status , Patient's Chart, lab work & pertinent test results  Airway Mallampati: II  TM Distance: >3 FB Neck ROM: Full    Dental no notable dental hx.    Pulmonary asthma , Current Smoker and Patient abstained from smoking.   Pulmonary exam normal breath sounds clear to auscultation       Cardiovascular negative cardio ROS Normal cardiovascular exam Rhythm:Regular Rate:Normal     Neuro/Psych   Anxiety     negative neurological ROS  negative psych ROS   GI/Hepatic negative GI ROS, Neg liver ROS,,,  Endo/Other  negative endocrine ROS    Renal/GU negative Renal ROS  negative genitourinary   Musculoskeletal negative musculoskeletal ROS (+)    Abdominal  (+) + obese  Peds negative pediatric ROS (+)  Hematology negative hematology ROS (+)   Anesthesia Other Findings   Reproductive/Obstetrics negative OB ROS                             Anesthesia Physical Anesthesia Plan  ASA: 2  Anesthesia Plan: General   Post-op Pain Management: Regional block* and Minimal or no pain anticipated   Induction: Intravenous  PONV Risk Score and Plan: 1 and Ondansetron and Treatment may vary due to age or medical condition  Airway Management Planned: Oral ETT  Additional Equipment:   Intra-op Plan:   Post-operative Plan: Extubation in OR  Informed Consent: I have reviewed the patients History and Physical, chart, labs and discussed the procedure including the risks, benefits and alternatives for the proposed anesthesia with the patient or authorized representative who has indicated his/her understanding and acceptance.     Dental advisory given  Plan Discussed with: CRNA  Anesthesia Plan Comments:        Anesthesia Quick Evaluation

## 2023-10-08 NOTE — Anesthesia Procedure Notes (Signed)
 Procedure Name: Intubation Date/Time: 10/08/2023 11:37 AM  Performed by: Orlin Hilding, CRNAPre-anesthesia Checklist: Patient identified, Emergency Drugs available, Suction available and Patient being monitored Patient Re-evaluated:Patient Re-evaluated prior to induction Oxygen Delivery Method: Circle system utilized Preoxygenation: Pre-oxygenation with 100% oxygen Induction Type: IV induction Ventilation: Mask ventilation without difficulty Laryngoscope Size: Mac and 4 Grade View: Grade I Tube type: Oral Tube size: 7.5 mm Number of attempts: 1 Airway Equipment and Method: Stylet and Oral airway Placement Confirmation: ETT inserted through vocal cords under direct vision, positive ETCO2 and breath sounds checked- equal and bilateral Secured at: 22 cm Tube secured with: Tape Dental Injury: Teeth and Oropharynx as per pre-operative assessment

## 2023-10-08 NOTE — Telephone Encounter (Signed)
 Note completed and sent to pt's mychart and faxed as requested

## 2023-10-08 NOTE — Anesthesia Procedure Notes (Signed)
 Anesthesia Regional Block: Interscalene brachial plexus block   Pre-Anesthetic Checklist: , timeout performed,  Correct Patient, Correct Site, Correct Laterality,  Correct Procedure, Correct Position, site marked,  Risks and benefits discussed,  Surgical consent,  Pre-op evaluation,  At surgeon's request and post-op pain management  Laterality: Left  Prep: chloraprep       Needles:  Injection technique: Single-shot  Needle Type: Stimiplex     Needle Length: 9cm  Needle Gauge: 21     Additional Needles:   Procedures:,,,, ultrasound used (permanent image in chart),,    Narrative:  Start time: 10/08/2023 9:31 AM End time: 10/08/2023 9:36 AM Injection made incrementally with aspirations every 5 mL.  Performed by: Personally  Anesthesiologist: Lowella Curb, MD

## 2023-10-08 NOTE — Anesthesia Postprocedure Evaluation (Signed)
 Anesthesia Post Note  Patient: Miguel Gallegos  Procedure(s) Performed: ARTHROSCOPY, SHOULDER WITH DEBRIDEMENT (Left)     Patient location during evaluation: PACU Anesthesia Type: General Level of consciousness: awake and alert Pain management: pain level controlled Vital Signs Assessment: post-procedure vital signs reviewed and stable Respiratory status: spontaneous breathing, nonlabored ventilation and respiratory function stable Cardiovascular status: blood pressure returned to baseline and stable Postop Assessment: no apparent nausea or vomiting Anesthetic complications: no   No notable events documented.  Last Vitals:  Vitals:   10/08/23 1315 10/08/23 1330  BP: (!) 123/98 121/82  Pulse: 82 85  Resp: 13 11  Temp:  36.6 C  SpO2: 97% 100%    Last Pain:  Vitals:   10/08/23 1330  TempSrc:   PainSc: 0-No pain                 Lowella Curb

## 2023-10-08 NOTE — Transfer of Care (Signed)
 Immediate Anesthesia Transfer of Care Note  Patient: Miguel Gallegos  Procedure(s) Performed: ARTHROSCOPY, SHOULDER WITH DEBRIDEMENT (Left)  Patient Location: PACU  Anesthesia Type:GA combined with regional for post-op pain  Level of Consciousness: awake, oriented, and patient cooperative  Airway & Oxygen Therapy: Patient Spontanous Breathing and Patient connected to face mask oxygen  Post-op Assessment: Report given to RN and Post -op Vital signs reviewed and stable  Post vital signs: Reviewed and stable  Last Vitals:  Vitals Value Taken Time  BP 135/84 10/08/23 1254  Temp 36.6 C 10/08/23 1254  Pulse 89 10/08/23 1258  Resp 18 10/08/23 1258  SpO2 98 % 10/08/23 1258  Vitals shown include unfiled device data.  Last Pain:  Vitals:   10/08/23 0935  TempSrc:   PainSc: (P) 0-No pain         Complications: No notable events documented.

## 2023-10-08 NOTE — H&P (Signed)
 Miguel Gallegos is an 38 y.o. male.   Chief Complaint: left shoulder pain HPI: Miguel Gallegos is a 38 y.o. male who presents with continued left shoulder pain.. Patient denies any changes in symptoms.  Continues to complain mainly of superior and anterior left shoulder pain.  He had AC joint injection under ultrasound at his last visit that provided him with about 80% relief for several days before it wore off.  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  MRI scan does show some edema in the coronal views in the distal clavicle.  Rotator cuff intact and the superior labrum also appears intact  Past Medical History:  Diagnosis Date   Anxiety    Asthma    Chronic left shoulder pain 06/04/2023   Seizures (HCC)    last seizure 2005   Tobacco use disorder 06/04/2023    Past Surgical History:  Procedure Laterality Date   EYE SURGERY     I was a child so i can't remember the actual date    History reviewed. No pertinent family history. Social History:  reports that he has been smoking cigarettes. He has a 12.5 pack-year smoking history. He has never used smokeless tobacco. He reports current alcohol use of about 1.0 standard drink of alcohol per week. He reports current drug use. Frequency: 4.00 times per week. Drug: Marijuana.  Allergies: No Known Allergies  Medications Prior to Admission  Medication Sig Dispense Refill   acetaminophen (TYLENOL) 500 MG tablet Take 1 tablet (500 mg total) by mouth every 6 (six) hours as needed for mild pain (pain score 1-3). 30 tablet 2   albuterol (VENTOLIN HFA) 108 (90 Base) MCG/ACT inhaler Inhale 2 puffs into the lungs every 6 (six) hours as needed for wheezing or shortness of breath. 6.7 g 3   ibuprofen (ADVIL) 200 MG tablet Take 400 mg by mouth every 8 (eight) hours as needed (pain.).     ondansetron (ZOFRAN-ODT) 4 MG disintegrating tablet Take 1 tablet (4 mg total) by mouth every 8 (eight) hours as  needed for nausea or vomiting. 20 tablet 0    Results for orders placed or performed during the hospital encounter of 10/07/23 (from the past 48 hours)  CBC     Status: Abnormal   Collection Time: 10/07/23  1:15 PM  Result Value Ref Range   WBC 8.8 4.0 - 10.5 K/uL   RBC 6.07 (H) 4.22 - 5.81 MIL/uL   Hemoglobin 16.9 13.0 - 17.0 g/dL   HCT 16.1 09.6 - 04.5 %   MCV 83.9 80.0 - 100.0 fL   MCH 27.8 26.0 - 34.0 pg   MCHC 33.2 30.0 - 36.0 g/dL   RDW 40.9 81.1 - 91.4 %   Platelets 275 150 - 400 K/uL   nRBC 0.0 0.0 - 0.2 %    Comment: Performed at [facility], [address] [place], [place] [address]  Basic metabolic panel     Status: None   Collection Time: 10/07/23  1:15 PM  Result Value Ref Range   Sodium 137 135 - 145 mmol/L   Potassium 4.4 3.5 - 5.1 mmol/L   Chloride 104 98 - 111 mmol/L   CO2 25 22 - 32 mmol/L   Glucose, Bld 79 70 - 99 mg/dL    Comment: Glucose reference range applies only to samples taken after fasting for at least 8 hours.  BUN 12 6 - 20 mg/dL   Creatinine, Ser 1.19 0.61 - 1.24 mg/dL   Calcium 9.8 8.9 - 14.7 mg/dL   GFR, Estimated >82 >95 mL/min    Comment: (NOTE) Calculated using the CKD-EPI Creatinine Equation (2021)    Anion gap 8 5 - 15    Comment: Performed at [facility] [place] [facility], [address] [place], [place] [address]   No results found.  Review of Systems  Musculoskeletal:  Positive for arthralgias.  All other systems reviewed and are negative.   Blood pressure 123/89, pulse 95, temperature 98.7 F (37.1 C), temperature source Tympanic, resp. rate 17, height 5\' 6"  (1.676 m), weight 92.1 kg, SpO2 95%. Physical Exam Vitals reviewed.  HENT:     Head: Normocephalic.     Nose: Nose normal.     Mouth/Throat:     Mouth: Mucous membranes are moist.  Eyes:     Pupils: Pupils are equal, round, and reactive to light.  Cardiovascular:     Rate and Rhythm: Normal rate.     Pulses: Normal pulses.  Pulmonary:     Effort: Pulmonary effort  is normal.  Abdominal:     General: Abdomen is flat.  Musculoskeletal:     Cervical back: Normal range of motion.  Skin:    General: Skin is warm.     Capillary Refill: Capillary refill takes less than 2 seconds.  Neurological:     General: No focal deficit present.     Mental Status: He is alert.  Psychiatric:        Mood and Affect: Mood normal.     Ortho exam demonstrates left shoulder with about 65 degrees external rotation, 110 degrees abduction, 175 degrees forward elevation passively and actively.  Excellent rotator cuff strength of supra, infra, subscap.  There is no significant coarse grinding or crepitus noted with passive motion of the shoulder.  Has point tenderness primarily over the [facility] joint on the left shoulder with moderate to severe tenderness here.  He also has mild to moderate tenderness over the bicipital groove with a little bit of pain reproduced with supination strength testing.  There is no Popeye deformity.  He has intact EPL, FPL, finger abduction.  Positive O'Brien sign.  2+ radial pulse of left upper extremity.   Assessment/Plan Impression is left shoulder pain with AC joint edema which is likely symptomatic.  MRI does demonstrate some degenerative changes in the [facility] joint with fluid and a little bit of edema in the distal clavicle.  This correlates with his 80% relief from [facility] joint injection at the last visit.  Unfortunately it was short-lived and with his continued severe shoulder pain, he would like to have definitive management of this problem.  We discussed the risks and benefits of surgery including the recovery timeframe.  He would still like to proceed after discussion.  He will be posted for arthroscopic distal clavicle excision and possible bicep tenodesis through a mini open incision based on how the bicep tendon and the superior labrum look at the time of arthroscopy.  No significant abnormalities noted to the bicep tendon on MRI scan but clinically he does  have point tenderness over the bicipital groove as well as positive O'Brien sign and pain reproduced with supination strength testing.  Follow-up after procedure.   Burnard Bunting, MD 10/08/2023, 10:23 AM

## 2023-10-08 NOTE — Op Note (Unsigned)
 NAMESherley Gallegos, [name] MEDICAL RECORD NO: [id number] ACCOUNT NO: [id number] DATE OF BIRTH: 1986/03/14 FACILITY: [facility] LOCATION: [facility] PHYSICIAN: Graylin Shiver. August Saucer, MD  Operative Report   DATE OF PROCEDURE: 10/08/2023  PREOPERATIVE DIAGNOSIS:  Left shoulder AC joint arthritis.  POSTOPERATIVE DIAGNOSIS:  Left shoulder AC joint arthritis.  PROCEDURE:  Left shoulder arthroscopy with arthroscopic distal clavicle excision, bursectomy, and acromioplasty.  SURGEON:  Graylin Shiver. August Saucer, MD  ASSISTANT:  Karenann Cai, PA.  INDICATIONS:  This is a 38 year old patient with left shoulder pain.  He presents for operative management after explanation of risks and benefits.  Had good relief from an [facility] joint injection but presents now for further operative management.  DESCRIPTION OF PROCEDURE:  The patient was brought to the operating room where general anesthetic was induced.  Preoperative antibiotics administered.  Timeout was called.  The patient was placed in the beach chair position with the head in neutral  position.  The left shoulder, arm and hand prescrubbed with alcohol and Betadine, allowed to air dry, prepped with DuraPrep solution and draped in a sterile manner.  Ioban used to seal the operative field and cover the axilla.  After calling timeout, the  posterior portal was created 2 cm medial and inferior to the posterolateral margin of the acromion.  Diagnostic arthroscopy was performed.  The patient had intact rotator cuff from the articular side.  Intact glenohumeral articular surfaces.  Mild  fraying around the biceps anchor, but overall the biceps anchor was stable and intact.  Biceps tendon also intact.  Small amount of debridement performed on the anterosuperior labrum with a smooth shaver.  Next, scope placed into the subacromial space.   Bursectomy performed.  Acromioplasty performed.  Distal clavicle was visualized and soft tissue was removed from around the distal clavicle using the  ArthroCare wand.  Next, using a 4 mm burr, the distal 8 to 9 mm of the clavicle was resected maintaining  the posterior and superior ligaments.  Good decompression was achieved.  The subacromial space was thoroughly irrigated.  Instruments were removed and the portals were closed using 3-0 Vicryl and 3-0 nylon with impervious dressings applied.  The patient  tolerated the procedure well without immediate complications.  Luke's assistance was required for opening and closing and mobilization of tissue.  His assistance was a medical necessity.   PUS D: 10/08/2023 1:12:15 pm T: 10/08/2023 4:30:00 pm  JOB: [id number]/ [id number]

## 2023-10-08 NOTE — Brief Op Note (Signed)
   10/08/2023  1:07 PM  PATIENT:  Miguel Gallegos  38 y.o. male  PRE-OPERATIVE DIAGNOSIS:  left shoulder  acromioclavicular osteoarthritis  POST-OPERATIVE DIAGNOSIS:  left shoulder cromioclavicular osteoarthritis  PROCEDURE:  Procedure(s): ARTHROSCOPY, SHOULDER WITH DEBRIDEMENT  SURGEON:  Surgeon(s): August Saucer, Corrie Mckusick, MD  ASSISTANT: magnant pa  ANESTHESIA:   general  EBL: 5 ml    Total I/O In: 100 [IV Piggyback:100] Out: 25 [Blood:25]  BLOOD ADMINISTERED: none  DRAINS: none   LOCAL MEDICATIONS USED:  none  SPECIMEN:  No Specimen  COUNTS:  YES  TOURNIQUET:  * No tourniquets in log *  DICTATION: .Other Dictation: Dictation Number [id number]  PLAN OF CARE: Discharge to home after PACU  PATIENT DISPOSITION:  PACU - hemodynamically stable

## 2023-10-09 ENCOUNTER — Encounter (HOSPITAL_COMMUNITY): Payer: Self-pay | Admitting: Orthopedic Surgery

## 2023-10-11 ENCOUNTER — Inpatient Hospital Stay: Payer: Self-pay | Admitting: Nurse Practitioner

## 2023-10-13 DIAGNOSIS — M7542 Impingement syndrome of left shoulder: Secondary | ICD-10-CM

## 2023-10-13 DIAGNOSIS — M19012 Primary osteoarthritis, left shoulder: Secondary | ICD-10-CM

## 2023-10-13 DIAGNOSIS — M7552 Bursitis of left shoulder: Secondary | ICD-10-CM

## 2023-10-15 ENCOUNTER — Telehealth: Payer: Self-pay | Admitting: Orthopedic Surgery

## 2023-10-16 ENCOUNTER — Ambulatory Visit: Admitting: Surgical

## 2023-10-16 ENCOUNTER — Encounter: Payer: Self-pay | Admitting: Surgical

## 2023-10-16 DIAGNOSIS — Z9889 Other specified postprocedural states: Secondary | ICD-10-CM

## 2023-10-17 ENCOUNTER — Other Ambulatory Visit: Payer: Self-pay | Admitting: Surgical

## 2023-10-17 MED ORDER — OXYCODONE HCL 5 MG PO TABS: 5.0000 mg | ORAL_TABLET | Freq: Four times a day (QID) | ORAL | 0 refills | Status: AC | PRN

## 2023-10-18 ENCOUNTER — Encounter: Payer: Self-pay | Admitting: Surgical

## 2023-10-18 NOTE — Progress Notes (Signed)
 Post-Op Visit Note   Patient: Miguel Gallegos           Date of Birth: [birth year]           MRN: [id number] Visit Date: 10/16/2023 PCP: Patient, No Pcp Per   Assessment & Plan:  Chief Complaint:  Chief Complaint  Patient presents with   Left Shoulder - Routine Post Op    10/08/2023 left shoulder arthroscopy with debridement   Visit Diagnoses: No diagnosis found.  Plan: Ramir Malerba is a 38 y.o. male who presents s/p left shoulder distal clavicle excision on 10/08/2023.  Patient is doing well and pain is overall controlled. Denies any chest pain, SOB, fevers, chills. Taking pain medication every 4 hours.    On exam, patient has range of motion 50 degrees X rotation, 90 degrees abduction, 125 degrees forward elevation passively.  Intact EPL, FPL, finger abduction, finger adduction, pronation/supination, bicep, tricep, deltoid of operative extremity.  Axillary nerve intact with deltoid firing.  Incisions are healing well without evidence of infection or dehiscence.  Sutures removed and replaced with Steri-Strips today.  2+ radial pulse of the operative extremity  Plan is discontinue sling.  Start range of motion exercises.  He prefers to do these at home by himself as opposed to working with physical therapist.  Exercises were provided for the patient and he will follow-up with the office in 4 weeks for clinical recheck.  If he has any significant stiffness at next appointment, we may need to consider physical therapy referral but for now, his early range of motion seems to be not too stiff.  Pain medication refilled..   Follow-Up Instructions: No follow-ups on file.   Orders:  No orders of the defined types were placed in this encounter.  No orders of the defined types were placed in this encounter.   Imaging: No results found.  PMFS History: Patient Active Problem List   Diagnosis Date Noted   Arthritis of left acromioclavicular joint 10/13/2023   Bursitis of left shoulder  10/13/2023   Impingement syndrome of left shoulder 10/13/2023   Chronic left shoulder pain 06/04/2023   Tobacco use disorder 06/04/2023   Prediabetes 06/04/2023   Marijuana user 06/04/2023   Anxiety    Past Medical History:  Diagnosis Date   Anxiety    Asthma    Chronic left shoulder pain 06/04/2023   Seizures (HCC)    last seizure 2005   Tobacco use disorder 06/04/2023    No family history on file.  Past Surgical History:  Procedure Laterality Date   EYE SURGERY     I was a child so i can't remember the actual date   POSTERIOR LUMBAR FUSION 2 WITH HARDWARE REMOVAL Left 10/08/2023   Procedure: ARTHROSCOPY, SHOULDER WITH DEBRIDEMENT;  Surgeon: Cammy Copa, MD;  Location: [facility] OR;  Service: Orthopedics;  Laterality: Left;  LEFT SHOULDER ARTHROSCOPY, DEBRIDEMENT, DISTAL CLAVICLE EXCISION, POSSIBLE MINI OPEN BICEPS TENODESIS   Social History   Occupational History   Not on file  Tobacco Use   Smoking status: Every Day    Current packs/day: 0.50    Average packs/day: 0.5 packs/day for 25.0 years (12.5 ttl pk-yrs)    Types: Cigarettes   Smokeless tobacco: Never  Vaping Use   Vaping status: Never Used  Substance and Sexual Activity   Alcohol use: Yes    Alcohol/week: 1.0 standard drink of alcohol    Types: 1 Cans of beer per week    Comment: Socially   Drug  use: Yes    Frequency: 4.0 times per week    Types: Marijuana   Sexual activity: Yes    Birth control/protection: None

## 2023-10-21 NOTE — Telephone Encounter (Signed)
 Can you set up appointment for Miguel Gallegos?

## 2023-11-13 ENCOUNTER — Ambulatory Visit (INDEPENDENT_AMBULATORY_CARE_PROVIDER_SITE_OTHER): Admitting: Surgical

## 2023-11-13 ENCOUNTER — Encounter: Payer: Self-pay | Admitting: Surgical

## 2023-11-13 DIAGNOSIS — Z9889 Other specified postprocedural states: Secondary | ICD-10-CM

## 2023-11-13 NOTE — Progress Notes (Signed)
 Post-Op Visit Note   Patient: Miguel Gallegos           Date of Birth: 14-Mar-1986           MRN: [id number] Visit Date: 11/13/2023 PCP: Patient, No Pcp Per   Assessment & Plan:  Chief Complaint:  Chief Complaint  Patient presents with   Left Shoulder - Follow-up, Routine Post Op    10/08/2023 left shoulder arthroscopy with debridement   Visit Diagnoses:  1. S/P arthroscopy of left shoulder     Plan: Patient is a 38 year old male who presents s/p left shoulder arthroscopy with distal clavicle excision on 10/08/2023.  Doing well.  Continually getting better.  Has not plateaued yet.  He only has occasional pain that he takes Tylenol  and ibuprofen  for.  He can now start laying on that left side.  He is 4 weeks out since yesterday.  On exam, incisions are well-healed.  Axillary nerve intact with deltoid firing.  He has excellent rotator cuff strength and strength of the left upper extremity.  There is minimal tenderness over the [facility] joint.  He has range of motion demonstrating passive external rotation to 40 degrees, abduction to 110 degrees.  Forward elevation to 160 degrees  Plan at this time is continue with home exercise program.  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  Follow-up in 6 weeks for final check.  Follow-Up Instructions: No follow-ups on file.   Orders:  No orders of the defined types were placed in this encounter.  No orders of the defined types were placed in this encounter.   Imaging: No results found.  PMFS History: Patient Active Problem List   Diagnosis Date Noted   Arthritis of left acromioclavicular joint 10/13/2023   Bursitis of left shoulder 10/13/2023   Impingement syndrome of left shoulder 10/13/2023   Chronic left shoulder pain 06/04/2023   Tobacco use disorder 06/04/2023   Prediabetes 06/04/2023   Marijuana user 06/04/2023   Anxiety     Past Medical History:  Diagnosis Date   Anxiety    Asthma    Chronic left shoulder pain 06/04/2023   Seizures (HCC)    last seizure 2005   Tobacco use disorder 06/04/2023    No family history on file.  Past Surgical History:  Procedure Laterality Date   EYE SURGERY     I was a child so i can't remember the actual date   POSTERIOR LUMBAR FUSION 2 WITH HARDWARE REMOVAL Left 10/08/2023   Procedure: ARTHROSCOPY, SHOULDER WITH DEBRIDEMENT;  Surgeon: Jasmine Mesi, MD;  Location: [facility] OR;  Service: Orthopedics;  Laterality: Left;  LEFT SHOULDER ARTHROSCOPY, DEBRIDEMENT, DISTAL CLAVICLE EXCISION, POSSIBLE MINI OPEN BICEPS TENODESIS   Social History   Occupational History   Not on file  Tobacco Use   Smoking status: Every Day    Current packs/day: 0.50    Average packs/day: 0.5 packs/day for 25.0 years (12.5 ttl pk-yrs)    Types: Cigarettes   Smokeless tobacco: Never  Vaping Use   Vaping status: Never Used  Substance and Sexual Activity   Alcohol use: Yes    Alcohol/week: 1.0 standard drink of alcohol    Types: 1 Cans of beer per week    Comment: Socially   Drug use: Yes    Frequency: 4.0 times per week    Types: Marijuana  Sexual activity: Yes    Birth control/protection: None

## 2023-12-06 NOTE — Telephone Encounter (Signed)
 Done

## 2023-12-25 ENCOUNTER — Ambulatory Visit: Admitting: Surgical

## 2023-12-30 ENCOUNTER — Ambulatory Visit (INDEPENDENT_AMBULATORY_CARE_PROVIDER_SITE_OTHER): Admitting: Surgical

## 2023-12-30 DIAGNOSIS — Z9889 Other specified postprocedural states: Secondary | ICD-10-CM

## 2023-12-31 ENCOUNTER — Ambulatory Visit: Payer: Self-pay | Admitting: Nurse Practitioner

## 2024-01-05 ENCOUNTER — Encounter: Payer: Self-pay | Admitting: Surgical

## 2024-01-05 NOTE — Progress Notes (Signed)
 Post-Op Visit Note   Patient: Miguel Gallegos           Date of Birth: May 27, 1986           MRN: [id number] Visit Date: 12/30/2023 PCP: Patient, No Pcp Per   Assessment & Plan:  Chief Complaint:  Chief Complaint  Patient presents with   Left Shoulder - Routine Post Op, Follow-up     10/08/2023 left shoulder arthroscopy with debridement, DCE     Visit Diagnoses:  1. S/P arthroscopy of left shoulder     Plan: Patient is a 38 year old male who presents for evaluation following left shoulder arthroscopy with distal clavicle excision on 10/08/2023.  Overall he is doing a lot better and still has some very occasional discomfort but this is controlled with just occasional usage of Tylenol .  He is able to lay on his left shoulder without difficulty at night.  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  It is not especially more sore at the end of the day.  On exam, patient has 70 degrees X rotation, 110 degrees abduction, 160 degrees forward elevation both actively and passively.  Incisions are well-healed.  Axillary nerve intact with deltoid firing.  2+ radial pulse of the operative extremity.  Excellent rotator cuff strength of supra, infra, subscap.  Plan at this time is follow-up with the office as needed.  He has done a very good job rehabbing himself after surgery and he is welcome to return with any new symptoms.  Follow-Up Instructions: No follow-ups on file.   Orders:  No orders of the defined types were placed in this encounter.  No orders of the defined types were placed in this encounter.   Imaging: No results found.  PMFS History: Patient Active Problem List   Diagnosis Date Noted   Arthritis of left acromioclavicular joint 10/13/2023   Bursitis of left shoulder 10/13/2023   Impingement syndrome of left shoulder 10/13/2023   Chronic left shoulder pain 06/04/2023   Tobacco use disorder 06/04/2023   Prediabetes  06/04/2023   Marijuana user 06/04/2023   Anxiety    Past Medical History:  Diagnosis Date   Anxiety    Asthma    Chronic left shoulder pain 06/04/2023   Seizures (HCC)    last seizure 2005   Tobacco use disorder 06/04/2023    No family history on file.  Past Surgical History:  Procedure Laterality Date   EYE SURGERY     I was a child so i can't remember the actual date   POSTERIOR LUMBAR FUSION 2 WITH HARDWARE REMOVAL Left 10/08/2023   Procedure: ARTHROSCOPY, SHOULDER WITH DEBRIDEMENT;  Surgeon: Addie Cordella Hamilton, MD;  Location: [facility] OR;  Service: Orthopedics;  Laterality: Left;  LEFT SHOULDER ARTHROSCOPY, DEBRIDEMENT, DISTAL CLAVICLE EXCISION, POSSIBLE MINI OPEN BICEPS TENODESIS   Social History   Occupational History   Not on file  Tobacco Use   Smoking status: Every Day    Current packs/day: 0.50    Average packs/day: 0.5 packs/day for 25.0 years (12.5 ttl pk-yrs)    Types: Cigarettes   Smokeless tobacco: Never  Vaping Use   Vaping status: Never Used  Substance and Sexual Activity   Alcohol use: Yes    Alcohol/week: 1.0 standard drink of alcohol    Types: 1 Cans of beer per week    Comment: Socially   Drug use: Yes    Frequency: 4.0 times per week  Types: Marijuana   Sexual activity: Yes    Birth control/protection: None

## 2024-05-18 ENCOUNTER — Encounter: Payer: Self-pay | Admitting: Radiology

## 2024-07-21 ENCOUNTER — Encounter (HOSPITAL_COMMUNITY): Payer: Self-pay | Admitting: Orthopedic Surgery
